# Patient Record
Sex: Female | Born: 1965 | Race: White | Hispanic: No | Marital: Married | Smoking: Never smoker
Health system: Southern US, Community
[De-identification: ages and names within clinical notes are randomized; demographics above are authoritative.]

## PROBLEM LIST (undated history)

## (undated) DIAGNOSIS — I729 Aneurysm of unspecified site: Secondary | ICD-10-CM

## (undated) DIAGNOSIS — I2699 Other pulmonary embolism without acute cor pulmonale: Secondary | ICD-10-CM

## (undated) HISTORY — DX: Aneurysm of unspecified site: I72.9

## (undated) HISTORY — DX: Other pulmonary embolism without acute cor pulmonale: I26.99

## (undated) HISTORY — PX: INGUINAL HERNIA REPAIR: SUR1180

## (undated) HISTORY — PX: WRIST SURGERY: SHX841

---

## 2001-04-16 ENCOUNTER — Encounter: Payer: Self-pay | Admitting: Obstetrics & Gynecology

## 2001-04-16 ENCOUNTER — Encounter: Admission: RE | Admit: 2001-04-16 | Discharge: 2001-04-16 | Payer: Self-pay | Admitting: Obstetrics & Gynecology

## 2002-04-26 ENCOUNTER — Other Ambulatory Visit: Admission: RE | Admit: 2002-04-26 | Discharge: 2002-04-26 | Payer: Self-pay | Admitting: Obstetrics & Gynecology

## 2003-05-05 ENCOUNTER — Other Ambulatory Visit: Admission: RE | Admit: 2003-05-05 | Discharge: 2003-05-05 | Payer: Self-pay | Admitting: Obstetrics & Gynecology

## 2005-06-13 ENCOUNTER — Encounter: Admission: RE | Admit: 2005-06-13 | Discharge: 2005-06-13 | Payer: Self-pay | Admitting: Obstetrics & Gynecology

## 2005-06-29 ENCOUNTER — Encounter (INDEPENDENT_AMBULATORY_CARE_PROVIDER_SITE_OTHER): Payer: Self-pay | Admitting: *Deleted

## 2005-06-29 ENCOUNTER — Ambulatory Visit (HOSPITAL_BASED_OUTPATIENT_CLINIC_OR_DEPARTMENT_OTHER): Admission: RE | Admit: 2005-06-29 | Discharge: 2005-06-29 | Payer: Self-pay | Admitting: *Deleted

## 2006-07-28 ENCOUNTER — Encounter: Admission: RE | Admit: 2006-07-28 | Discharge: 2006-07-28 | Payer: Self-pay | Admitting: Obstetrics & Gynecology

## 2007-07-30 ENCOUNTER — Encounter: Admission: RE | Admit: 2007-07-30 | Discharge: 2007-07-30 | Payer: Self-pay | Admitting: Obstetrics & Gynecology

## 2008-08-13 ENCOUNTER — Encounter: Admission: RE | Admit: 2008-08-13 | Discharge: 2008-08-13 | Payer: Self-pay | Admitting: Obstetrics & Gynecology

## 2009-08-17 ENCOUNTER — Encounter: Admission: RE | Admit: 2009-08-17 | Discharge: 2009-08-17 | Payer: Self-pay | Admitting: Obstetrics & Gynecology

## 2010-07-29 ENCOUNTER — Other Ambulatory Visit: Payer: Self-pay | Admitting: Obstetrics & Gynecology

## 2010-07-29 DIAGNOSIS — Z1231 Encounter for screening mammogram for malignant neoplasm of breast: Secondary | ICD-10-CM

## 2010-08-20 ENCOUNTER — Ambulatory Visit
Admission: RE | Admit: 2010-08-20 | Discharge: 2010-08-20 | Disposition: A | Discharge status: Home / Self Care | Payer: BC Managed Care – PPO | Source: Ambulatory Visit | Attending: Obstetrics & Gynecology | Admitting: Obstetrics & Gynecology

## 2010-08-20 DIAGNOSIS — Z1231 Encounter for screening mammogram for malignant neoplasm of breast: Secondary | ICD-10-CM

## 2010-08-20 NOTE — Op Note (Signed)
Tara, Stafford NO.:  [id number]   MEDICAL RECORD NO.:  [id number]          PATIENT TYPE:  AMB   LOCATION:  DSC                          FACILITY:  [facility]   PHYSICIAN:  Alfonse Ras, MD   DATE OF BIRTH:  1965-08-06   DATE OF PROCEDURE:  06/29/2005  DATE OF DISCHARGE:  06/29/2005                                 OPERATIVE REPORT   PREOPERATIVE DIAGNOSIS:  Left inguinal hernia.   POSTOPERATIVE DIAGNOSIS:  Left inguinal hernia, endometrioma of the indirect  sac.   OPERATION PERFORMED:  Left inguinal hernia repair with mesh.   SURGEON:  Alfonse Ras, MD   ANESTHESIA:  General.   DESCRIPTION OF PROCEDURE:  The patient was taken to the operating room and  placed in supine position.  After adequate  anesthesia was induced, the left  groin was prepped and draped in the normal sterile fashion.  Using an  oblique incision over the inguinal canal, I dissected down to the external  oblique fascia.  This was opened along its fibers.  Round ligament was  identified and an indirect hernia sac which was adjacent to it was dissected  off the round ligament.  Round ligament was suture ligated and divided.  The  hernia sac appeared to contain a significant amount of liquid.  It was  opened and blood tinged serous fluid was removed.  The sac was then ligated  at the internal ring and sent for pathologic evaluation which on final  pathology shows evidence of endometriosis.  The floor of Hesselbach's  triangle was reinforced with a piece of polypropylene mesh, tacking it to  the pubic tubercle, the transversalis fascia, the inguinal ligament and  brought out lateral to the internal ring.  Adequate hemostasis was ensured.  The external oblique fascia was closed with a running 3-0 Vicryl suture.  The skin was closed with staples.  The patient tolerated the procedure well  and went to PACU in good condition.      Alfonse Ras, MD  Electronically Signed     KRE/MEDQ  D:  07/28/2005  T:  07/29/2005  Job:  [id number]

## 2010-08-20 NOTE — Op Note (Signed)
NAMEJOYOUS, GLEGHORN NO.:  [id number]   MEDICAL RECORD NO.:  [id number]          PATIENT TYPE:  AMB   LOCATION:  DSC                          FACILITY:  [facility]   PHYSICIAN:  Alfonse Ras, MD   DATE OF BIRTH:  1966-02-08   DATE OF PROCEDURE:  06/29/2005  DATE OF DISCHARGE:                                 OPERATIVE REPORT   PREOPERATIVE DIAGNOSIS:  Left inguinal hernia.   POSTOPERATIVE DIAGNOSIS:  Left inguinal hernia.   OPERATION PERFORMED:  Left inguinal hernia repair with mesh.   SURGEON:  Alfonse Ras, MD   ANESTHESIA:  General.   DESCRIPTION OF PROCEDURE:  The patient was taken to the operating room and  placed in supine position.  After adequate general anesthesia was induced  using laryngeal mask, the left groin was prepped and draped in the normal  sterile fashion.  An oblique incision was made over the inguinal canal and I  dissected down to the external oblique fascia.  On opening the external  oblique fascia the hernia sac was entered and a large amount of blood tinged  serosanguineous fluid was encountered.  The sac was then completely  mobilized and was an indirect hernia sac.  Contents were inspected.  There  was no evidence of bowel, omentum or colon.  It was twisted and truncated at  its base and excised.  It was ligated with a 0 Surgilon.  The hernia defect  was closed by using interrupted 0 Surgilon sutures to approximately  transversalis fascia to the edge of the inguinal ligament in an interrupted  fashion.  A piece of Prolene mesh was then placed over this to reinforce it  and tacked using  1 and 2-0 Prolene suture from the pubic tubercle along the  transversalis fascia brought out far lateral to the internal ring and  attached it to the inguinal ligament.  Adequate hemostasis was ensured.  The  fascia was closed with a running 3-0 Vicryl.  Skin was closed with staples.  The patient tolerated the procedure well and was taken to PACU  in good  condition.      Alfonse Ras, MD  Electronically Signed     KRE/MEDQ  D:  06/29/2005  T:  07/01/2005  Job:  [id number]

## 2010-10-05 ENCOUNTER — Other Ambulatory Visit: Payer: Self-pay | Admitting: Obstetrics & Gynecology

## 2011-08-14 ENCOUNTER — Encounter (HOSPITAL_COMMUNITY): Payer: Self-pay | Admitting: Emergency Medicine

## 2011-08-14 ENCOUNTER — Emergency Department (HOSPITAL_COMMUNITY)
Admission: EM | Admit: 2011-08-14 | Discharge: 2011-08-15 | Disposition: A | Discharge status: Home / Self Care | Payer: 59 | Attending: Emergency Medicine | Admitting: Emergency Medicine

## 2011-08-14 ENCOUNTER — Emergency Department (HOSPITAL_COMMUNITY): Payer: 59

## 2011-08-14 DIAGNOSIS — K4090 Unilateral inguinal hernia, without obstruction or gangrene, not specified as recurrent: Secondary | ICD-10-CM | POA: Insufficient documentation

## 2011-08-14 DIAGNOSIS — R110 Nausea: Secondary | ICD-10-CM | POA: Insufficient documentation

## 2011-08-14 DIAGNOSIS — N83209 Unspecified ovarian cyst, unspecified side: Secondary | ICD-10-CM | POA: Insufficient documentation

## 2011-08-14 DIAGNOSIS — R109 Unspecified abdominal pain: Secondary | ICD-10-CM | POA: Insufficient documentation

## 2011-08-14 LAB — DIFFERENTIAL
Eosinophils Absolute: 0.1 10*3/uL (ref 0.0–0.7)
Lymphocytes Relative: 20 % (ref 12–46)
Lymphs Abs: 2 10*3/uL (ref 0.7–4.0)
Monocytes Relative: 6 % (ref 3–12)
Neutrophils Relative %: 73 % (ref 43–77)

## 2011-08-14 LAB — BASIC METABOLIC PANEL
BUN: 10 mg/dL (ref 6–23)
CO2: 27 mEq/L (ref 19–32)
Calcium: 8.9 mg/dL (ref 8.4–10.5)
Chloride: 104 mEq/L (ref 96–112)
GFR calc Af Amer: >90 mL/min (ref >90)
Glucose, Bld: 99 mg/dL (ref 70–99)
Potassium: 4.1 mEq/L (ref 3.5–5.1)

## 2011-08-14 LAB — URINALYSIS, ROUTINE W REFLEX MICROSCOPIC
Bilirubin Urine: NEGATIVE
Hgb urine dipstick: NEGATIVE
Nitrite: NEGATIVE
Specific Gravity, Urine: 1.006 (ref 1.005–1.030)
pH: 6.5 (ref 5.0–8.0)

## 2011-08-14 LAB — CBC
Hemoglobin: 13.2 g/dL (ref 12.0–15.0)
MCH: 31 pg (ref 26.0–34.0)
MCV: 92.5 fL (ref 78.0–100.0)
RBC: 4.26 MIL/uL (ref 3.87–5.11)

## 2011-08-14 LAB — PREGNANCY, URINE: Preg Test, Ur: NEGATIVE

## 2011-08-14 MED ORDER — MORPHINE SULFATE 4 MG/ML IJ SOLN
4.0000 mg | Freq: Once | INTRAMUSCULAR | Status: AC
  Administered 2011-08-14: 4 mg via INTRAVENOUS
  Filled 2011-08-14: qty 1

## 2011-08-14 MED ORDER — IOHEXOL 300 MG/ML  SOLN
20.0000 mL | INTRAMUSCULAR | Status: AC
  Administered 2011-08-14: 20 mL via ORAL

## 2011-08-14 MED ORDER — ONDANSETRON HCL 4 MG/2ML IJ SOLN
4.0000 mg | Freq: Once | INTRAMUSCULAR | Status: AC
  Administered 2011-08-14: 4 mg via INTRAVENOUS
  Filled 2011-08-14: qty 2

## 2011-08-14 MED ORDER — SODIUM CHLORIDE 0.9 % IV SOLN
INTRAVENOUS | Status: DC
  Administered 2011-08-14 (×2): via INTRAVENOUS

## 2011-08-14 MED ORDER — IOHEXOL 300 MG/ML  SOLN
100.0000 mL | Freq: Once | INTRAMUSCULAR | Status: AC | PRN
  Administered 2011-08-14: 100 mL via INTRAVENOUS

## 2011-08-14 NOTE — ED Notes (Signed)
C/o L sided abd pain that radiates to L flank x 1 week.  Reports nausea.  Denies urinary complaints.

## 2011-08-14 NOTE — ED Provider Notes (Signed)
History   This chart was scribed for Cheri Guppy, MD by Melba Coon. The patient was seen in room STRE6/STRE6 and the patient's care was started at 5:05PM.    CSN: [id number]  Arrival date & time 08/14/11  1543   None     Chief Complaint  Patient presents with  . Abdominal Pain    (Consider location/radiation/quality/duration/timing/severity/associated sxs/prior treatment) HPI Tara Stafford is a 46 y.o. female who presents to the Emergency Department complaining of constant, moderate to severe, left-sided, radiating, sharp abdominal pain with an onset one week ago. Pt is a nurse; last Sunday, pain started but she just thought she pulled a muscle and that it would go away; however, pain has gotten progressively worse. Physical exertion aggravates the pain. Pt has an inguinal hernia. Pt also states that she has a high tolerance for pain, but this is the worse pain she has felt. LNMP: 2 weeks ago. Nml urination. Nausea present. No HA, fever, neck pain, sore throat, rash, back pain, CP, SOB, emesis, diarrhea, dysuria, or extremity pain, edema, weakness, numbness, or tingling. Pt has Hx of C-sections and inguinal hernia repair. No known allergies. No other pertinent medical symptoms. Pt is a social drinker.  Past Medical History  Diagnosis Date  . Endometriosis     Past Surgical History  Procedure Date  . Cesarean section   . Inguinal hernia repair     No family history on file.  History  Substance Use Topics  . Smoking status: Never Smoker   . Smokeless tobacco: Not on file  . Alcohol Use: Yes    OB History    Grav Para Term Preterm Abortions TAB SAB Ect Mult Living                  Review of Systems 10 Systems reviewed and all are negative for acute change except as noted in the HPI.   Allergies  Review of patient's allergies indicates no known allergies.  Home Medications   Current Outpatient Rx  Name Route Sig Dispense Refill  . CALCIUM + D PO Oral  Take 1 tablet by mouth daily.    . OMEGA-3 FATTY ACIDS 1000 MG PO CAPS Oral Take 1 g by mouth daily.    Marland Kitchen NORGESTIM-ETH ESTRAD TRIPHASIC 0.18/0.215/0.25 MG-35 MCG PO TABS Oral Take 1 tablet by mouth daily.      BP 122/69  Pulse 64  Temp(Src) 98.1 F (36.7 C) (Oral)  Resp 16  SpO2 100%  LMP 07/31/2011  Physical Exam  Nursing note and vitals reviewed. Constitutional: She is oriented to person, place, and time. She appears well-developed and well-nourished. No distress.  HENT:  Head: Normocephalic and atraumatic.  Eyes: EOM are normal.  Neck: Normal range of motion. Neck supple. No tracheal deviation present.  Cardiovascular: Normal rate, regular rhythm and normal heart sounds.   No murmur heard. Pulmonary/Chest: Effort normal and breath sounds normal. No respiratory distress.       Lung sounds clear bilaterally  Abdominal: Soft. There is tenderness (moderate LLQ abd tenderness).  Musculoskeletal: Normal range of motion.  Neurological: She is alert and oriented to person, place, and time.  Skin: Skin is warm and dry.  Psychiatric: She has a normal mood and affect. Her behavior is normal.    ED Course  Procedures (including critical care time) 20, old female, with progressive left lower daughter, and abdominal pain, and tenderness.  For the past week.  Nausea, but no other symptoms.  Moderate tenderness  to palpation in the left lower quadrant, but no peritoneal signs.  No masses palpated.  Differential includes gynecological versus GI etiology for her symptoms.  We'll give IV analgesics, and antiemetics, and perform a CAT scan, and laboratory testing.  If the CT is negative.  a pelvic ultrasound is probably warranted.  DIAGNOSTIC STUDIES: Oxygen Saturation is 100% on room air, normal by my interpretation.    COORDINATION OF CARE:  5:12PM - EDMD will order IV fluids, UA, abd CT, and pain meds for the pt.  Labs Reviewed  BASIC METABOLIC PANEL - Abnormal; Notable for the  following:    GFR calc non Af Amer 86 (*)    All other components within normal limits  URINALYSIS, ROUTINE W REFLEX MICROSCOPIC  CBC  DIFFERENTIAL  PREGNANCY, URINE   No results found.   No diagnosis found.    MDM  Abdominal pain  I personally performed the services described in this documentation, which was scribed in my presence. The recorded information has been reviewed and considered.        Cheri Guppy, MD 08/14/11 [phone]

## 2011-08-14 NOTE — ED Notes (Signed)
Per [place] tech, advised pt that 2 others are in front of her.

## 2011-08-15 ENCOUNTER — Emergency Department (HOSPITAL_COMMUNITY): Payer: 59

## 2011-08-15 MED ORDER — HYDROCODONE-ACETAMINOPHEN 5-325 MG PO TABS: 1.0000 | ORAL_TABLET | ORAL | Status: AC | PRN

## 2011-08-15 MED ORDER — MORPHINE SULFATE 4 MG/ML IJ SOLN
4.0000 mg | Freq: Once | INTRAMUSCULAR | Status: AC
  Administered 2011-08-15: 4 mg via INTRAVENOUS
  Filled 2011-08-15: qty 1

## 2011-08-15 NOTE — ED Notes (Signed)
Patient transported to Ultrasound 

## 2011-08-15 NOTE — ED Notes (Signed)
Patient returned from Ultrasound. 

## 2011-08-15 NOTE — ED Provider Notes (Signed)
Tara Stafford S 8:00 PM patient discussed in sign out with Dr. Weldon Inches. Patient with left lower abdominal pains. Lab work has been unremarkable. CT was unremarkable aside from right-sided ovarian cyst. Ultrasound of pelvis pending. We'll continue to monitor patient and dispose according to ultrasound results.   Ultrasound demonstrates right and left ovarian cyst. Right cyst is 3.5 cm. There does not appear to be any other significant findings to explain emergent condition for patient's pain symptoms. At this time we'll discharge home with PCP an OB/GYN followup.     Angus Seller, PA 08/15/11 0201

## 2011-08-15 NOTE — Discharge Instructions (Signed)
You were seen and evaluated today for your complaints of left abdominal pains. Your lab tests today did not show any concerning signs or findings to explain your symptoms. Your CAT scan also did not show any signs for an emergent process in your abdomen. There was no signs today for appendicitis or inflammation of your bowels. You also had ultrasound studies performed. These did demonstrate a right and left ovarian cysts. The cyst on your right ovary was the largest at 3.5 cm. It is recommended that you continue to followup with your primary care provider or OB/GYN specialist regarding these findings. Return to the emergency room for any worsening symptoms, fever, chills, persistent nausea vomiting.  Abdominal Pain Abdominal pain can be caused by many things. Your caregiver decides the seriousness of your pain by an examination and possibly blood tests and X-rays. Many cases can be observed and treated at home. Most abdominal pain is not caused by a disease and will probably improve without treatment. However, in many cases, more time must pass before a clear cause of the pain can be found. Before that point, it may not be known if you need more testing, or if hospitalization or surgery is needed. HOME CARE INSTRUCTIONS   Do not take laxatives unless directed by your caregiver.   Take pain medicine only as directed by your caregiver.   Only take over-the-counter or prescription medicines for pain, discomfort, or fever as directed by your caregiver.   Try a clear liquid diet (broth, tea, or water) for as long as directed by your caregiver. Slowly move to a bland diet as tolerated.  SEEK IMMEDIATE MEDICAL CARE IF:   The pain does not go away.   You have a fever.   You keep throwing up (vomiting).   The pain is felt only in portions of the abdomen. Pain in the right side could possibly be appendicitis. In an adult, pain in the left lower portion of the abdomen could be colitis or diverticulitis.     You pass bloody or black tarry stools.  MAKE SURE YOU:   Understand these instructions.   Will watch your condition.   Will get help right away if you are not doing well or get worse.  Document Released: 12/29/2004 Document Revised: 03/10/2011 Document Reviewed: 11/07/2007 [facility] Patient Information 2012 [place], [place].    Ovarian Cyst The ovaries are small organs that are on each side of the uterus. The ovaries are the organs that produce the female hormones, estrogen and progesterone. An ovarian cyst is a sac filled with fluid that can vary in its size. It is normal for a small cyst to form in women who are in the childbearing age and who have menstrual periods. This type of cyst is called a follicle cyst that becomes an ovulation cyst (corpus luteum cyst) after it produces the women's egg. It later goes away on its own if the woman does not become pregnant. There are other kinds of ovarian cysts that may cause problems and may need to be treated. The most serious problem is a cyst with cancer. It should be noted that menopausal women who have an ovarian cyst are at a higher risk of it being a cancer cyst. They should be evaluated very quickly, thoroughly and followed closely. This is especially true in menopausal women because of the high rate of ovarian cancer in women in menopause. CAUSES AND TYPES OF OVARIAN CYSTS:  FUNCTIONAL CYST: The follicle/corpus luteum cyst is a functional cyst  that occurs every month during ovulation with the menstrual cycle. They go away with the next menstrual cycle if the woman does not get pregnant. Usually, there are no symptoms with a functional cyst.   ENDOMETRIOMA CYST: This cyst develops from the lining of the uterus tissue. This cyst gets in or on the ovary. It grows every month from the bleeding during the menstrual period. It is also called a "chocolate cyst" because it becomes filled with blood that turns brown. This cyst can cause pain in the  lower abdomen during intercourse and with your menstrual period.   CYSTADENOMA CYST: This cyst develops from the cells on the outside of the ovary. They usually are not cancerous. They can get very big and cause lower abdomen pain and pain with intercourse. This type of cyst can twist on itself, cut off its blood supply and cause severe pain. It also can easily rupture and cause a lot of pain.   DERMOID CYST: This type of cyst is sometimes found in both ovaries. They are found to have different kinds of body tissue in the cyst. The tissue includes skin, teeth, hair, and/or cartilage. They usually do not have symptoms unless they get very big. Dermoid cysts are rarely cancerous.   POLYCYSTIC OVARY: This is a rare condition with hormone problems that produces many small cysts on both ovaries. The cysts are follicle-like cysts that never produce an egg and become a corpus luteum. It can cause an increase in body weight, infertility, acne, increase in body and facial hair and lack of menstrual periods or rare menstrual periods. Many women with this problem develop type 2 diabetes. The exact cause of this problem is unknown. A polycystic ovary is rarely cancerous.   THECA LUTEIN CYST: Occurs when too much hormone (human chorionic gonadotropin) is produced and over-stimulates the ovaries to produce an egg. They are frequently seen when doctors stimulate the ovaries for invitro-fertilization (test tube babies).   LUTEOMA CYST: This cyst is seen during pregnancy. Rarely it can cause an obstruction to the birth canal during labor and delivery. They usually go away after delivery.  SYMPTOMS   Pelvic pain or pressure.   Pain during sexual intercourse.   Increasing girth (swelling) of the abdomen.   Abnormal menstrual periods.   Increasing pain with menstrual periods.   You stop having menstrual periods and you are not pregnant.  DIAGNOSIS  The diagnosis can be made during:  Routine or annual pelvic  examination (common).   Ultrasound.   X-ray of the pelvis.   CT Scan.   MRI.   Blood tests.  TREATMENT   Treatment may only be to follow the cyst monthly for 2 to 3 months with your caregiver. Many go away on their own, especially functional cysts.   May be aspirated (drained) with a long needle with ultrasound, or by laparoscopy (inserting a tube into the pelvis through a small incision).   The whole cyst can be removed by laparoscopy.   Sometimes the cyst may need to be removed through an incision in the lower abdomen.   Hormone treatment is sometimes used to help dissolve certain cysts.   Birth control pills are sometimes used to help dissolve certain cysts.  HOME CARE INSTRUCTIONS  Follow your caregiver's advice regarding:  Medicine.   Follow up visits to evaluate and treat the cyst.   You may need to come back or make an appointment with another caregiver, to find the exact cause of your cyst,  if your caregiver is not a gynecologist.   Get your yearly and recommended pelvic examinations and Pap tests.   Let your caregiver know if you have had an ovarian cyst in the past.  SEEK MEDICAL CARE IF:   Your periods are late, irregular, they stop, or are painful.   Your stomach (abdomen) or pelvic pain does not go away.   Your stomach becomes larger or swollen.   You have pressure on your bladder or trouble emptying your bladder completely.   You have painful sexual intercourse.   You have feelings of fullness, pressure, or discomfort in your stomach.   You lose weight for no apparent reason.   You feel generally ill.   You become constipated.   You lose your appetite.   You develop acne.   You have an increase in body and facial hair.   You are gaining weight, without changing your exercise and eating habits.   You think you are pregnant.  SEEK IMMEDIATE MEDICAL CARE IF:   You have increasing abdominal pain.   You feel sick to your stomach (nausea)  and/or vomit.   You develop a fever that comes on suddenly.   You develop abdominal pain during a bowel movement.   Your menstrual periods become heavier than usual.  Document Released: 03/21/2005 Document Revised: 03/10/2011 Document Reviewed: 01/22/2009 [facility] Patient Information 2012 [place], [place].    RESOURCE GUIDE  Dental Problems  Patients with Medicaid: [facility] [phone]                                           [id number] Phone:  [phone]                                                  Phone:  [phone]  If unable to pay or uninsured, contact:  [facility] or [facility]. to become qualified for the adult dental clinic.  Chronic Pain Problems Contact [facility]  [phone] Patients need to be referred by their primary care doctor.  Insufficient Money for Medicine Contact [employer]:  call "211" or [facility] [phone].  No Primary Care Doctor Call [facility]  434-424-5323 Other agencies that provide inexpensive medical care    [facility]  [phone]    [facility] Internal Medicine  [phone]    [facility]  [phone]    [facility]  [phone]    [facility]anned Parenthood  [phone]    [facility]  [phone]  Psychological Services [facility] Behavioral Health  [phone] [facility]  [phone] [facility] Mental Health   [phone] (emergency services [phone])  Substance Abuse Resources [facility]  617-529-5456 [facility] (413)312-9318 [facility] (762)478-5766 Floydene Flock 312-336-0282 Residential & Outpatient Substance Abuse Program  (534) 188-3707  Abuse/Neglect [facility] Child Abuse Hotline (317) 038-9708 [facility] Child Abuse Hotline 479-303-5223 (After Hours)  Emergency Shelter [facility] (272)438-2891  Maternity Homes Room at  the [place] [facility] [place] 747-391-2165 [facility] Services (  [phone]  MRSA Hotline #:   440-101-8614    [facility] Resources  [facility]     [employer]                          [facility] Dept. [address] [place]                       [address]      [address]  Blondell Reveal Phone:  [phone]                                   Phone:  [phone]                 Phone:  [phone]  [facility] Phone:  [phone]  [facility] Child Abuse Hotline 430-630-0569 775-281-0902 (After Hours)

## 2011-08-18 NOTE — ED Provider Notes (Signed)
Medical screening examination/treatment/procedure(s) were conducted as a shared visit with non-physician practitioner(s) and myself.  I personally evaluated the patient during the encounter.  No acute abdomen. CT scan of abdomen negative.  Ultrasound reveals right 3.5 cm ovarian cyst. Pain is on the left side.  Can be discharged  Donnetta Hutching, MD 08/18/11 [phone]

## 2011-08-22 ENCOUNTER — Other Ambulatory Visit: Payer: Self-pay | Admitting: Obstetrics & Gynecology

## 2011-08-22 DIAGNOSIS — Z1231 Encounter for screening mammogram for malignant neoplasm of breast: Secondary | ICD-10-CM

## 2011-08-30 ENCOUNTER — Ambulatory Visit
Admission: RE | Admit: 2011-08-30 | Discharge: 2011-08-30 | Disposition: A | Discharge status: Home / Self Care | Payer: 59 | Source: Ambulatory Visit | Attending: Obstetrics & Gynecology | Admitting: Obstetrics & Gynecology

## 2011-08-30 DIAGNOSIS — Z1231 Encounter for screening mammogram for malignant neoplasm of breast: Secondary | ICD-10-CM

## 2012-08-21 ENCOUNTER — Other Ambulatory Visit: Payer: Self-pay

## 2012-08-21 DIAGNOSIS — Z1231 Encounter for screening mammogram for malignant neoplasm of breast: Secondary | ICD-10-CM

## 2012-09-05 ENCOUNTER — Ambulatory Visit
Admission: RE | Admit: 2012-09-05 | Discharge: 2012-09-05 | Disposition: A | Discharge status: Home / Self Care | Payer: 59 | Source: Ambulatory Visit

## 2012-09-05 DIAGNOSIS — Z1231 Encounter for screening mammogram for malignant neoplasm of breast: Secondary | ICD-10-CM

## 2013-03-15 ENCOUNTER — Ambulatory Visit (INDEPENDENT_AMBULATORY_CARE_PROVIDER_SITE_OTHER): Payer: 59

## 2013-03-15 VITALS — BP 133/82 | HR 68 | Resp 18

## 2013-03-15 DIAGNOSIS — R52 Pain, unspecified: Secondary | ICD-10-CM

## 2013-03-15 DIAGNOSIS — M6740 Ganglion, unspecified site: Secondary | ICD-10-CM

## 2013-03-15 NOTE — Patient Instructions (Signed)
ICE INSTRUCTIONS  Apply ice or cold pack to the affected area at least 3 times a day for 10-15 minutes each time.  You should also use ice after prolonged activity or vigorous exercise.  Do not apply ice longer than 20 minutes at one time.  Always keep a cloth between your skin and the ice pack to prevent burns.  Being consistent and following these instructions will help control your symptoms.  We suggest you purchase a gel ice pack because they are reusable and do bit leak.  Some of them are designed to wrap around the area.  Use the method that works best for you.  Here are some other suggestions for icing.   Use a frozen bag of peas or corn-inexpensive and molds well to your body, usually stays frozen for 10 to 20 minutes.  Wet a towel with cold water and squeeze out the excess until it's damp.  Place in a bag in the freezer for 20 minutes. Then remove and use.  Alternate warm compress/ ice pack application to left foot over the cyst area. Apply daily hot or warm moist towel for 10 minutes the apply ice pack for 10 minutes repeat this process 2-3 times at least once or twice a day for the next 2-3 weeks.

## 2013-03-15 NOTE — Progress Notes (Signed)
   Subjective:    Patient ID: Tara Stafford, female    DOB: [birth year], 47 y.o.   MRN: [id number]  HPI My left foot and has a knot on top and it comes and goes and big toe is numb and has been going on for about 1 month     Review of Systems  Constitutional: Negative.   HENT: Negative.        Cold   Eyes: Negative.   Respiratory: Negative.   Cardiovascular: Negative.   Gastrointestinal: Negative.   Endocrine: Negative.   Genitourinary: Negative.   Musculoskeletal: Negative.   Skin: Negative.   Allergic/Immunologic: Negative.   Neurological: Positive for numbness.  Hematological: Negative.   Psychiatric/Behavioral: Negative.        Objective:   Physical Exam Neurovascular status is intact pedal pulses palpable bilateral Refill time 3 seconds all digits. Patient does have a numb spot over the first webspace and dorsum of the left hallux and present for last month actually happened since the appeared. There is a palpable nodular lesion over the second met cuneiform articulations site left foot. Slightly tender on direct palpation is freely movable from the underlying bone and overlying skin almost more like in consistency. Patient has a history of previous hammertoe surgery second toe left foot with well-healed scar from years ago. No history of recent trauma although surgeries may have aggravated the site over the dorsum of her left foot. X-rays reveal no exostoses no fractures no other osseous abnormalities currently soft tissue lesions noted likely ganglion cyst left foot       Assessment & Plan:   ganglion cyst left foot. Possibly some secondary neuritis or neuralgia neuropathy due to the compression patient also has a numb spot on the lateral left thigh area from a hernia repair that was done years ago and has a numb spot on the thigh that same leg. The numbness to the hallux may be coincidental however it may be from irritation or inflammation of the ganglion cyst. Patient is  currently on prednisone for sinus infection which may help reduce the inflammation of her foot suggested warm compress ice pack alternating and crossfire massage the area to manipulate the lesion if he continues to be painful symptomatic or enlarged they followup for excision or aspiration both options were discussed. Followup as needed with the next month if it persists or chews other more aggressive options  Alvan Dame DPM

## 2013-08-19 ENCOUNTER — Other Ambulatory Visit: Payer: Self-pay

## 2013-08-19 DIAGNOSIS — Z1231 Encounter for screening mammogram for malignant neoplasm of breast: Secondary | ICD-10-CM

## 2013-09-06 ENCOUNTER — Ambulatory Visit
Admission: RE | Admit: 2013-09-06 | Discharge: 2013-09-06 | Disposition: A | Discharge status: Home / Self Care | Payer: 59 | Source: Ambulatory Visit

## 2013-09-06 ENCOUNTER — Encounter (INDEPENDENT_AMBULATORY_CARE_PROVIDER_SITE_OTHER): Payer: Self-pay

## 2013-09-06 DIAGNOSIS — Z1231 Encounter for screening mammogram for malignant neoplasm of breast: Secondary | ICD-10-CM

## 2014-09-22 ENCOUNTER — Other Ambulatory Visit: Payer: Self-pay

## 2014-09-22 DIAGNOSIS — Z1231 Encounter for screening mammogram for malignant neoplasm of breast: Secondary | ICD-10-CM

## 2014-09-26 ENCOUNTER — Ambulatory Visit
Admission: RE | Admit: 2014-09-26 | Discharge: 2014-09-26 | Disposition: A | Discharge status: Home / Self Care | Payer: 59 | Source: Ambulatory Visit

## 2014-09-26 DIAGNOSIS — Z1231 Encounter for screening mammogram for malignant neoplasm of breast: Secondary | ICD-10-CM

## 2015-09-18 ENCOUNTER — Other Ambulatory Visit: Payer: Self-pay | Admitting: Obstetrics & Gynecology

## 2015-09-18 DIAGNOSIS — Z1231 Encounter for screening mammogram for malignant neoplasm of breast: Secondary | ICD-10-CM

## 2015-10-02 ENCOUNTER — Ambulatory Visit
Admission: RE | Admit: 2015-10-02 | Discharge: 2015-10-02 | Disposition: A | Discharge status: Home / Self Care | Payer: BLUE CROSS/BLUE SHIELD | Source: Ambulatory Visit | Attending: Obstetrics & Gynecology | Admitting: Obstetrics & Gynecology

## 2015-10-02 DIAGNOSIS — Z1231 Encounter for screening mammogram for malignant neoplasm of breast: Secondary | ICD-10-CM

## 2016-03-18 ENCOUNTER — Encounter: Payer: Self-pay | Admitting: Sports Medicine

## 2016-03-18 ENCOUNTER — Ambulatory Visit (INDEPENDENT_AMBULATORY_CARE_PROVIDER_SITE_OTHER): Payer: BLUE CROSS/BLUE SHIELD | Admitting: Sports Medicine

## 2016-03-18 DIAGNOSIS — M2042 Other hammer toe(s) (acquired), left foot: Secondary | ICD-10-CM | POA: Diagnosis not present

## 2016-03-18 DIAGNOSIS — Q742 Other congenital malformations of lower limb(s), including pelvic girdle: Secondary | ICD-10-CM | POA: Diagnosis not present

## 2016-03-18 DIAGNOSIS — M79675 Pain in left toe(s): Secondary | ICD-10-CM

## 2016-03-18 NOTE — Progress Notes (Signed)
Subjective: Tara Stafford is a 50 y.o. female patient who presents to office for evaluation of Left foot pain. Patient complains of progressive pain especially over several weeks at the left third toe. Reports that she had surgery back in 2011 to fix the left second hammertoe by Dr. Ralene CorkSikora and now the toe is significantly shorter making the left third toe the longest toe. She thinks that when she walks or stands excessive that her foot is sliding forward and that the toe is getting irritated states that over the last few weeks it has been so painful and very tender, especially with direct touch to the very and of the toe and to the very end of the nail. Patient also states that she has noticed callus forming to the tip of the toe as well as the toe starting to change in shape and a more hammered appearance. Patient reports that she has tried changing shoes and buying bigger size shoe, but she thinks this makes it worse. Because her feet slide around in shoe more which causes the long toe to be traumatized and painful. Patient denies any other pedal complaints.   There are no active problems to display for this patient.   Current Outpatient Prescriptions on File Prior to Visit  Medication Sig Dispense Refill  . azithromycin (ZITHROMAX) 250 MG tablet     . Calcium Carbonate-Vitamin D (CALCIUM + D PO) Take 1 tablet by mouth daily.    Marland Kitchen. CHERATUSSIN AC 100-10 MG/5ML syrup     . fish oil-omega-3 fatty acids 1000 MG capsule Take 1 g by mouth daily.    . Multiple Vitamin (MULTIVITAMIN) tablet Take 1 tablet by mouth daily.    . Norgestimate-Ethinyl Estradiol Triphasic (TRI-SPRINTEC) 0.18/0.215/0.25 MG-35 MCG tablet Take 1 tablet by mouth daily.    . predniSONE (DELTASONE) 20 MG tablet      No current facility-administered medications on file prior to visit.     No Known Allergies  Objective:  General: Alert and oriented x3 in no acute distress  Dermatology:Old surgical scar well healed. Small  hyperkeratotic lesion At the distal tuft of the left third toe and overlying fourth PIPJs dorsally bilateral. No open lesions bilateral lower extremities, no webspace macerations, no ecchymosis bilateral, all nails x 10 are well manicured.  Vascular: Dorsalis Pedis and Posterior Tibial pedal pulses 2/4, Capillary Fill Time 3 seconds,(+) pedal hair growth bilateral, no edema bilateral lower extremities, Temperature gradient within normal limits.  Neurology: Michaell CowingGross sensation intact via light touch bilateral.   Musculoskeletal: Semi-flexible hammertoes 3-5 on left and 2 through 5 on right with Mild tenderness with palpation at distal tufts of the longest toe, which is the left third toe. Ankle, Subtalar, Midtarsal, and MTPJ joint range of motion is within normal limits, there is no 1st ray hypermobility noted bilateral, No symptomatic bunion deformity noted bilateral. No pain with calf compression bilateral.  Strength within normal limits in all groups bilateral.        Assessment and Plan: Problem List Items Addressed This Visit    None    Visit Diagnoses    Hammertoe of left foot    -  Primary   Long toe       Toe pain, left          -Complete examination performed -Patient declined x-rays -Discussed treatement options for long, early left third hammertoe -Patient opt for conservative care at this time -Dispense left third toe silicone And metatarsal padding -Advised patient to choose  her shoes wisely that will give her more space in her toe box to prevent irritation to the problematic, left third toe -Encouraged patient to consider orthotics for long-term management -Patient to return to office as needed or sooner if condition worsens. Advised patient if this fails to offer any relief with conservative care to consider surgical correction of the elongated hammer third toe. Patient expressed understanding and will call for reappointment if necessary.  Asencion Islamitorya Zackery Brine, DPM

## 2016-11-15 ENCOUNTER — Other Ambulatory Visit: Payer: Self-pay | Admitting: Obstetrics & Gynecology

## 2016-11-15 DIAGNOSIS — Z1231 Encounter for screening mammogram for malignant neoplasm of breast: Secondary | ICD-10-CM

## 2016-11-28 ENCOUNTER — Ambulatory Visit
Admission: RE | Admit: 2016-11-28 | Discharge: 2016-11-28 | Disposition: A | Discharge status: Home / Self Care | Payer: 59 | Source: Ambulatory Visit | Attending: Obstetrics & Gynecology | Admitting: Obstetrics & Gynecology

## 2016-11-28 DIAGNOSIS — Z1231 Encounter for screening mammogram for malignant neoplasm of breast: Secondary | ICD-10-CM

## 2016-12-19 ENCOUNTER — Encounter: Payer: BLUE CROSS/BLUE SHIELD | Admitting: Obstetrics & Gynecology

## 2017-01-12 ENCOUNTER — Encounter: Payer: BLUE CROSS/BLUE SHIELD | Admitting: Obstetrics & Gynecology

## 2017-01-16 ENCOUNTER — Encounter: Payer: BLUE CROSS/BLUE SHIELD | Admitting: Obstetrics & Gynecology

## 2017-02-01 ENCOUNTER — Encounter: Payer: Self-pay | Admitting: Obstetrics & Gynecology

## 2017-02-01 ENCOUNTER — Ambulatory Visit (INDEPENDENT_AMBULATORY_CARE_PROVIDER_SITE_OTHER): Payer: 59 | Admitting: Obstetrics & Gynecology

## 2017-02-01 VITALS — BP 124/80 | Ht 69.75 in | Wt 213.0 lb

## 2017-02-01 DIAGNOSIS — Z780 Asymptomatic menopausal state: Secondary | ICD-10-CM | POA: Diagnosis not present

## 2017-02-01 DIAGNOSIS — N950 Postmenopausal bleeding: Secondary | ICD-10-CM | POA: Diagnosis not present

## 2017-02-01 DIAGNOSIS — Z01419 Encounter for gynecological examination (general) (routine) without abnormal findings: Secondary | ICD-10-CM | POA: Diagnosis not present

## 2017-02-01 DIAGNOSIS — Z86711 Personal history of pulmonary embolism: Secondary | ICD-10-CM | POA: Diagnosis not present

## 2017-02-01 NOTE — Patient Instructions (Signed)
1. Encounter for routine gynecological examination with Papanicolaou smear of cervix Normal gyn exam.  Pap reflex done.  Breasts wnl.  Mammo normal 2018. - CBC - Comp Met (CMET) - Lipid Profile - TSH - Vitamin D 1,25 dihydroxy  2. Menopause present Had recent vaginal bleeding.  Will recheck [facility].  No HRT.  Recent h/o bilateral PE.   - FSH  3. Postmenopausal bleeding R/O Endometrial pathology, polyp, fibroid, endometrial hyperplasia, endometrial cancer.  F/U pelvic US, possible EBx. - US Transvaginal Non-OB; Future  4. History of pulmonary embolism Work-up negative.  Tara Stafford, it was a pleasure seeing you today!  I will see you again soon for your Pelvic US.

## 2017-02-01 NOTE — Progress Notes (Signed)
Tara Stafford [birth year] [id number]   History:    51 y.o. G4P4  Boyfriend.  RP:  Established patient presenting for annual gyn exam  HPI:  Bilateral PE May 2018 on BCPs after traveling by plane.  D/Ced BCPs at that time.  Just finished Eliquis.  Had a menstrual period in 08/2016, then spotting x 2 weeks in Oct. 2018.  [place] in the spring per patient.  No pelvic pain.  Breasts wnl.  Mictions/BMs wnl.  Past medical history,surgical history, family history and social history were all reviewed and documented in the EPIC chart.  Gynecologic History Contraception: condoms Last Pap: 2012. Results were: normal Last mammogram: 2018. Results were: normal Colono to organize  Obstetric History OB History  Gravida Para Term Preterm AB Living  4 4       4   SAB TAB Ectopic Multiple Live Births               # Outcome Date GA Lbr Len/2nd Weight Sex Delivery Anes PTL Lv  4 Para           3 Para           2 Para           1 Para                ROS: A ROS was performed and pertinent positives and negatives are included in the history.  GENERAL: No fevers or chills. HEENT: No change in vision, no earache, sore throat or sinus congestion. NECK: No pain or stiffness. CARDIOVASCULAR: No chest pain or pressure. No palpitations. PULMONARY: No shortness of breath, cough or wheeze. GASTROINTESTINAL: No abdominal pain, nausea, vomiting or diarrhea, melena or bright red blood per rectum. GENITOURINARY: No urinary frequency, urgency, hesitancy or dysuria. MUSCULOSKELETAL: No joint or muscle pain, no back pain, no recent trauma. DERMATOLOGIC: No rash, no itching, no lesions. ENDOCRINE: No polyuria, polydipsia, no heat or cold intolerance. No recent change in weight. HEMATOLOGICAL: No anemia or easy bruising or bleeding. NEUROLOGIC: No headache, seizures, numbness, tingling or weakness. PSYCHIATRIC: No depression, no loss of interest in normal activity or change in sleep pattern.     Exam:   BP  124/80   Ht 5' 9.75" (1.772 m)   Wt 213 lb (96.6 kg)   BMI 30.78 kg/m   Body mass index is 30.78 kg/m.  General appearance : Well developed well nourished female. No acute distress HEENT: Eyes: no retinal hemorrhage or exudates,  Neck supple, trachea midline, no carotid bruits, no thyroidmegaly Lungs: Clear to auscultation, no rhonchi or wheezes, or rib retractions  Heart: Regular rate and rhythm, no murmurs or gallops Breast:Examined in sitting and supine position were symmetrical in appearance, no palpable masses or tenderness,  no skin retraction, no nipple inversion, no nipple discharge, no skin discoloration, no axillary or supraclavicular lymphadenopathy Abdomen: no palpable masses or tenderness, no rebound or guarding Extremities: no edema or skin discoloration or tenderness  Pelvic: Vulva normal  Bartholin, Urethra, Skene Glands: Within normal limits             Vagina: No gross lesions or discharge  Cervix: No gross lesions or discharge.  Pap reflex done.  Uterus  AV, normal size, shape and consistency, non-tender and mobile  Adnexa  Without masses or tenderness  Anus and perineum  normal   Assessment/Plan:  51 y.o. female for annual exam   1. Encounter for routine gynecological examination with Papanicolaou smear of cervix  Normal gyn exam.  Pap reflex done.  Breasts wnl.  Mammo normal 2018. - CBC - Comp Met (CMET) - Lipid Profile - TSH - Vitamin D 1,25 dihydroxy  2. Menopause present Had recent vaginal bleeding.  Will recheck [facility].  No HRT.  Recent h/o bilateral PE.   - FSH  3. Postmenopausal bleeding R/O Endometrial pathology, polyp, fibroid, endometrial hyperplasia, endometrial cancer.  F/U pelvic US, possible EBx. - US Transvaginal Non-OB; Future  4. History of pulmonary embolism Work-up negative.  Counseling >50% x 10 minutes  Princess Bruins MD, 8:23 AM 02/01/2017

## 2017-02-03 LAB — PAP IG W/ RFLX HPV ASCU

## 2017-02-06 LAB — COMPREHENSIVE METABOLIC PANEL
AG Ratio: 1.6 (calc) (ref 1.0–2.5)
ALT: 16 U/L (ref 6–29)
AST: 18 U/L (ref 10–35)
Albumin: 4.4 g/dL (ref 3.6–5.1)
Alkaline phosphatase (APISO): 67 U/L (ref 33–130)
BUN: 14 mg/dL (ref 7–25)
CHLORIDE: 103 mmol/L (ref 98–110)
CO2: 27 mmol/L (ref 20–32)
CREATININE: 0.86 mg/dL (ref 0.50–1.05)
Calcium: 9.2 mg/dL (ref 8.6–10.4)
GLOBULIN: 2.8 g/dL (ref 1.9–3.7)
GLUCOSE: 71 mg/dL (ref 65–99)
POTASSIUM: 4.1 mmol/L (ref 3.5–5.3)
SODIUM: 138 mmol/L (ref 135–146)
TOTAL PROTEIN: 7.2 g/dL (ref 6.1–8.1)
Total Bilirubin: 0.8 mg/dL (ref 0.2–1.2)

## 2017-02-06 LAB — CBC
HEMATOCRIT: 42.5 % (ref 35.0–45.0)
Hemoglobin: 14.7 g/dL (ref 11.7–15.5)
MCH: 30.8 pg (ref 27.0–33.0)
MCHC: 34.6 g/dL (ref 32.0–36.0)
MCV: 88.9 fL (ref 80.0–100.0)
MPV: 11.3 fL (ref 7.5–12.5)
Platelets: 204 10*3/uL (ref 140–400)
RBC: 4.78 10*6/uL (ref 3.80–5.10)
RDW: 13 % (ref 11.0–15.0)
WBC: 5.8 10*3/uL (ref 3.8–10.8)

## 2017-02-06 LAB — FOLLICLE STIMULATING HORMONE: FSH: 73.5 m[IU]/mL

## 2017-02-06 LAB — LIPID PANEL
CHOL/HDL RATIO: 2.3 (calc) (ref <5.0)
Cholesterol: 224 mg/dL — ABNORMAL HIGH (ref <200)
HDL: 99 mg/dL (ref >50)
LDL Cholesterol (Calc): 111 mg/dL (calc) — ABNORMAL HIGH
NON-HDL CHOLESTEROL (CALC): 125 mg/dL (ref <130)
Triglycerides: 62 mg/dL (ref <150)

## 2017-02-06 LAB — VITAMIN D 1,25 DIHYDROXY
VITAMIN D3 1, 25 (OH): 36 pg/mL
Vitamin D 1, 25 (OH)2 Total: 36 pg/mL (ref 18–72)

## 2017-02-06 LAB — TSH: TSH: 1.22 m[IU]/L

## 2017-02-22 ENCOUNTER — Other Ambulatory Visit: Payer: 59

## 2017-02-22 ENCOUNTER — Ambulatory Visit: Payer: 59 | Admitting: Obstetrics & Gynecology

## 2017-10-24 ENCOUNTER — Other Ambulatory Visit: Payer: Self-pay | Admitting: Obstetrics & Gynecology

## 2017-10-24 DIAGNOSIS — Z1231 Encounter for screening mammogram for malignant neoplasm of breast: Secondary | ICD-10-CM

## 2018-01-05 ENCOUNTER — Ambulatory Visit: Payer: 59

## 2018-01-12 ENCOUNTER — Ambulatory Visit
Admission: RE | Admit: 2018-01-12 | Discharge: 2018-01-12 | Disposition: A | Discharge status: Home / Self Care | Payer: 59 | Source: Ambulatory Visit | Attending: Obstetrics & Gynecology | Admitting: Obstetrics & Gynecology

## 2018-01-12 DIAGNOSIS — Z1231 Encounter for screening mammogram for malignant neoplasm of breast: Secondary | ICD-10-CM

## 2018-02-02 ENCOUNTER — Encounter: Payer: 59 | Admitting: Obstetrics & Gynecology

## 2018-02-08 ENCOUNTER — Encounter: Payer: 59 | Admitting: Obstetrics & Gynecology

## 2018-02-23 ENCOUNTER — Ambulatory Visit (INDEPENDENT_AMBULATORY_CARE_PROVIDER_SITE_OTHER): Payer: 59 | Admitting: Obstetrics & Gynecology

## 2018-02-23 ENCOUNTER — Encounter: Payer: Self-pay | Admitting: Obstetrics & Gynecology

## 2018-02-23 VITALS — BP 126/78 | Ht 69.0 in | Wt 221.0 lb

## 2018-02-23 DIAGNOSIS — Z01419 Encounter for gynecological examination (general) (routine) without abnormal findings: Secondary | ICD-10-CM

## 2018-02-23 DIAGNOSIS — Z780 Asymptomatic menopausal state: Secondary | ICD-10-CM | POA: Diagnosis not present

## 2018-02-23 DIAGNOSIS — E6609 Other obesity due to excess calories: Secondary | ICD-10-CM | POA: Diagnosis not present

## 2018-02-23 DIAGNOSIS — Z86711 Personal history of pulmonary embolism: Secondary | ICD-10-CM

## 2018-02-23 DIAGNOSIS — Z6832 Body mass index (BMI) 32.0-32.9, adult: Secondary | ICD-10-CM

## 2018-02-23 NOTE — Progress Notes (Signed)
Tara Stafford 1965/11/17 [id number]   History:    52 y.o. [place], getting married in 2020.  RP:  Established patient presenting for annual gyn exam   HPI: Menopause, well on no HRT.  No PMB.  High FSH and LMP 05/2017.  No pelvic pain.  No pain with IC.  Breasts normal.  BMI 32.64.  Plans to increase physical activity and improve nutrition.  Fasting Health Labs here today.  Past medical history,surgical history, family history and social history were all reviewed and documented in the EPIC chart.  Gynecologic History No LMP recorded. Patient is perimenopausal. Contraception: post menopausal status/Condoms Last Pap: 01/2017. Results were: Negative Last mammogram: 01/2018. Results were: Negative Bone Density: Never Colonoscopy: Will schedule 1st Screening Colono now  Obstetric History OB History  Gravida Para Term Preterm AB Living  4 4       4   SAB TAB Ectopic Multiple Live Births               # Outcome Date GA Lbr Len/2nd Weight Sex Delivery Anes PTL Lv  4 Para           3 Para           2 Para           1 Para              ROS: A ROS was performed and pertinent positives and negatives are included in the history.  GENERAL: No fevers or chills. HEENT: No change in vision, no earache, sore throat or sinus congestion. NECK: No pain or stiffness. CARDIOVASCULAR: No chest pain or pressure. No palpitations. PULMONARY: No shortness of breath, cough or wheeze. GASTROINTESTINAL: No abdominal pain, nausea, vomiting or diarrhea, melena or bright red blood per rectum. GENITOURINARY: No urinary frequency, urgency, hesitancy or dysuria. MUSCULOSKELETAL: No joint or muscle pain, no back pain, no recent trauma. DERMATOLOGIC: No rash, no itching, no lesions. ENDOCRINE: No polyuria, polydipsia, no heat or cold intolerance. No recent change in weight. HEMATOLOGICAL: No anemia or easy bruising or bleeding. NEUROLOGIC: No headache, seizures, numbness, tingling or weakness.  PSYCHIATRIC: No depression, no loss of interest in normal activity or change in sleep pattern.     Exam:   BP 126/78 (BP Location: Right Arm, Patient Position: Sitting, Cuff Size: Large)   Ht 5' 9"  (1.753 m)   Wt 221 lb (100.2 kg)   BMI 32.64 kg/m   Body mass index is 32.64 kg/m.  General appearance : Well developed well nourished female. No acute distress HEENT: Eyes: no retinal hemorrhage or exudates,  Neck supple, trachea midline, no carotid bruits, no thyroidmegaly Lungs: Clear to auscultation, no rhonchi or wheezes, or rib retractions  Heart: Regular rate and rhythm, no murmurs or gallops Breast:Examined in sitting and supine position were symmetrical in appearance, no palpable masses or tenderness,  no skin retraction, no nipple inversion, no nipple discharge, no skin discoloration, no axillary or supraclavicular lymphadenopathy Abdomen: no palpable masses or tenderness, no rebound or guarding Extremities: no edema or skin discoloration or tenderness  Pelvic: Vulva: Normal             Vagina: No gross lesions or discharge  Cervix: No gross lesions or discharge.  Pap/HPV HR done  Uterus  AV, normal size, shape and consistency, non-tender and mobile  Adnexa  Without masses or tenderness  Anus: Normal   Assessment/Plan:  52 y.o. female for annual exam   1. Encounter for routine gynecological  examination with Papanicolaou smear of cervix Normal gynecologic exam and menopause.  Pap with high-risk HPV done today.  Breast exam normal.  Screening mammogram October 2019 was negative.  Health labs here today.  Will schedule screening colonoscopy now. - CBC - Comp Met (CMET) - TSH - Lipid panel - VITAMIN D 25 Hydroxy (Vit-D Deficiency, Fractures) - PAP,TP IMGw/HPV RNA,rflx HPVTYPE16,18/45  2. Menopause present Well on no hormone replacement therapy.  No postmenopausal bleeding.  Recommend vitamin D supplements, calcium intake of 1.5 g/day and regular weightbearing physical  activities.  3. Hx pulmonary embolism Bilateral pulmonary embolism in May 2018 while patient was on birth control pills and traveling by plane.  Anticoagulation finished more than a year ago.  4. Class 1 obesity due to excess calories without serious comorbidity with body mass index (BMI) of 32.0 to 32.9 in adult Recommend lower calorie/carb diet such as [employer].  Aerobic physical activities 5 times a week with weightlifting every 2 days.  Other orders - Ascorbic Acid (VITAMIN C PO); Take 1 tablet by mouth daily.  Princess Bruins MD, 8:59 AM 02/23/2018

## 2018-02-24 LAB — CBC
HCT: 42.9 % (ref 35.0–45.0)
HEMOGLOBIN: 14.6 g/dL (ref 11.7–15.5)
MCH: 30.9 pg (ref 27.0–33.0)
MCHC: 34 g/dL (ref 32.0–36.0)
MCV: 90.9 fL (ref 80.0–100.0)
MPV: 11.9 fL (ref 7.5–12.5)
Platelets: 221 10*3/uL (ref 140–400)
RBC: 4.72 10*6/uL (ref 3.80–5.10)
RDW: 12.1 % (ref 11.0–15.0)
WBC: 6.9 10*3/uL (ref 3.8–10.8)

## 2018-02-24 LAB — COMPREHENSIVE METABOLIC PANEL
AG RATIO: 1.6 (calc) (ref 1.0–2.5)
ALBUMIN MSPROF: 4.4 g/dL (ref 3.6–5.1)
ALT: 15 U/L (ref 6–29)
AST: 19 U/L (ref 10–35)
Alkaline phosphatase (APISO): 63 U/L (ref 33–130)
BUN: 15 mg/dL (ref 7–25)
CHLORIDE: 103 mmol/L (ref 98–110)
CO2: 24 mmol/L (ref 20–32)
Calcium: 9.7 mg/dL (ref 8.6–10.4)
Creat: 0.99 mg/dL (ref 0.50–1.05)
GLOBULIN: 2.7 g/dL (ref 1.9–3.7)
GLUCOSE: 83 mg/dL (ref 65–99)
POTASSIUM: 4.3 mmol/L (ref 3.5–5.3)
SODIUM: 138 mmol/L (ref 135–146)
TOTAL PROTEIN: 7.1 g/dL (ref 6.1–8.1)
Total Bilirubin: 0.9 mg/dL (ref 0.2–1.2)

## 2018-02-24 LAB — LIPID PANEL
CHOL/HDL RATIO: 3.2 (calc) (ref <5.0)
Cholesterol: 235 mg/dL — ABNORMAL HIGH (ref <200)
HDL: 73 mg/dL (ref >50)
LDL CHOLESTEROL (CALC): 145 mg/dL — AB
NON-HDL CHOLESTEROL (CALC): 162 mg/dL — AB (ref <130)
Triglycerides: 70 mg/dL (ref <150)

## 2018-02-24 LAB — TSH: TSH: 1 mIU/L

## 2018-02-24 LAB — VITAMIN D 25 HYDROXY (VIT D DEFICIENCY, FRACTURES): VIT D 25 HYDROXY: 24 ng/mL — AB (ref 30–100)

## 2018-02-26 ENCOUNTER — Other Ambulatory Visit: Payer: Self-pay | Admitting: Obstetrics & Gynecology

## 2018-02-26 MED ORDER — VITAMIN D (ERGOCALCIFEROL) 1.25 MG (50000 UNIT) PO CAPS: 50000.0000 [IU] | ORAL_CAPSULE | ORAL | 0 refills | Status: DC

## 2018-02-27 LAB — PAP, TP IMAGING W/ HPV RNA, RFLX HPV TYPE 16,18/45: HPV DNA High Risk: NOT DETECTED

## 2018-03-02 ENCOUNTER — Encounter: Payer: Self-pay | Admitting: Obstetrics & Gynecology

## 2018-03-02 NOTE — Patient Instructions (Signed)
1. Encounter for routine gynecological examination with Papanicolaou smear of cervix Normal gynecologic exam and menopause.  Pap with high-risk HPV done today.  Breast exam normal.  Screening mammogram October 2019 was negative.  Health labs here today.  Will schedule screening colonoscopy now. - CBC - Comp Met (CMET) - TSH - Lipid panel - VITAMIN D 25 Hydroxy (Vit-D Deficiency, Fractures) - PAP,TP IMGw/HPV RNA,rflx HPVTYPE16,18/45  2. Menopause present Well on no hormone replacement therapy.  No postmenopausal bleeding.  Recommend vitamin D supplements, calcium intake of 1.5 g/day and regular weightbearing physical activities.  3. Hx pulmonary embolism Bilateral pulmonary embolism in May 2018 while patient was on birth control pills and traveling by plane.  Anticoagulation finished more than a year ago.  4. Class 1 obesity due to excess calories without serious comorbidity with body mass index (BMI) of 32.0 to 32.9 in adult Recommend lower calorie/carb diet such as [employer].  Aerobic physical activities 5 times a week with weightlifting every 2 days.  Other orders - Ascorbic Acid (VITAMIN C PO); Take 1 tablet by mouth daily.  Tara Stafford, it was a pleasure seeing you today!  I will inform you of your results as soon as they are available.

## 2018-11-22 ENCOUNTER — Other Ambulatory Visit: Payer: Self-pay | Admitting: Obstetrics & Gynecology

## 2018-11-22 DIAGNOSIS — Z1231 Encounter for screening mammogram for malignant neoplasm of breast: Secondary | ICD-10-CM

## 2019-01-22 ENCOUNTER — Ambulatory Visit: Payer: 59

## 2019-02-25 ENCOUNTER — Ambulatory Visit
Admission: RE | Admit: 2019-02-25 | Discharge: 2019-02-25 | Disposition: A | Discharge status: Home / Self Care | Payer: 59 | Source: Ambulatory Visit | Attending: Obstetrics & Gynecology | Admitting: Obstetrics & Gynecology

## 2019-02-25 ENCOUNTER — Other Ambulatory Visit: Payer: Self-pay

## 2019-02-25 DIAGNOSIS — Z1231 Encounter for screening mammogram for malignant neoplasm of breast: Secondary | ICD-10-CM

## 2019-02-26 ENCOUNTER — Other Ambulatory Visit: Payer: Self-pay

## 2019-02-27 ENCOUNTER — Encounter: Payer: Self-pay | Admitting: Obstetrics & Gynecology

## 2019-02-27 ENCOUNTER — Ambulatory Visit (INDEPENDENT_AMBULATORY_CARE_PROVIDER_SITE_OTHER): Payer: 59 | Admitting: Obstetrics & Gynecology

## 2019-02-27 VITALS — BP 118/74 | Ht 69.5 in | Wt 195.0 lb

## 2019-02-27 DIAGNOSIS — Z780 Asymptomatic menopausal state: Secondary | ICD-10-CM | POA: Diagnosis not present

## 2019-02-27 DIAGNOSIS — Z01419 Encounter for gynecological examination (general) (routine) without abnormal findings: Secondary | ICD-10-CM

## 2019-02-27 DIAGNOSIS — E663 Overweight: Secondary | ICD-10-CM

## 2019-02-27 NOTE — Addendum Note (Signed)
Addended by: Thurnell Garbe A on: 02/27/2019 09:53 AM   Modules accepted: Orders

## 2019-02-27 NOTE — Patient Instructions (Signed)
1. Encounter for routine gynecological examination with Papanicolaou smear of cervix Normal gynecologic exam.  Pap reflex done.  Breast exam normal.  Screening mammogram done 2 days ago, pending results.  Fasting health labs here today.  Screening colonoscopy not done yet, recommend scheduling this year. - CBC - Comp Met (CMET) - Lipid panel - TSH - VITAMIN D 25 Hydroxy (Vit-D Deficiency, Fractures)  2. Postmenopause Well on no hormone replacement therapy.  No postmenopausal bleeding.  May use coconut oil for intercourse as needed.  Recommend vitamin D supplements, calcium intake of 1200 mg daily and regular weightbearing physical activities.  3. Overweight (BMI 25.0-29.9) Doing very well with Weight [employer] for weight loss.  Body mass index went from 32.64 last year to 28.38 now.  Also doing aerobic and weightlifting activities on a regular basis.  Other orders - OVER THE COUNTER MEDICATION; Cure med- vitamin  [name], it was a pleasure seeing you today!  I will inform you of your results as soon as they are available.

## 2019-02-27 NOTE — Progress Notes (Signed)
Tara Stafford [facility] [birth year] [id number]   History:    53 y.o. G4P4L4 Got married in 2020.  Nurse in Dialysis.  RP:  Established patient presenting for annual gyn exam   HPI: Menopause, well on no HRT.  No PMB.  High FSH and LMP 05/2017.  No pelvic pain.  No pain with IC.  Breasts normal.  BMI improved to 28.38 (from 32.64 last year).  Physically active and healthy nutrition, joined [employer] with her husband.  Fasting Health Labs here today.  Past medical history,surgical history, family history and social history were all reviewed and documented in the EPIC chart.  Gynecologic History Patient's last menstrual period was 07/22/2013. Contraception: post menopausal status Last Pap: 02/2018. Results were: Negative/HPV HR neg Last mammogram: 02/2019. Results were: Pending Bone Density: Never Colonoscopy: Not yet  Obstetric History OB History  Gravida Para Term Preterm AB Living  4 4       4   SAB TAB Ectopic Multiple Live Births               # Outcome Date GA Lbr Len/2nd Weight Sex Delivery Anes PTL Lv  4 Para           3 Para           2 Para           1 Para              ROS: A ROS was performed and pertinent positives and negatives are included in the history.  GENERAL: No fevers or chills. HEENT: No change in vision, no earache, sore throat or sinus congestion. NECK: No pain or stiffness. CARDIOVASCULAR: No chest pain or pressure. No palpitations. PULMONARY: No shortness of breath, cough or wheeze. GASTROINTESTINAL: No abdominal pain, nausea, vomiting or diarrhea, melena or bright red blood per rectum. GENITOURINARY: No urinary frequency, urgency, hesitancy or dysuria. MUSCULOSKELETAL: No joint or muscle pain, no back pain, no recent trauma. DERMATOLOGIC: No rash, no itching, no lesions. ENDOCRINE: No polyuria, polydipsia, no heat or cold intolerance. No recent change in weight. HEMATOLOGICAL: No anemia or easy bruising or bleeding. NEUROLOGIC: No headache, seizures,  numbness, tingling or weakness. PSYCHIATRIC: No depression, no loss of interest in normal activity or change in sleep pattern.     Exam:   BP 118/74   Ht 5' 9.5" (1.765 m)   Wt 195 lb (88.5 kg)   LMP 07/22/2013   BMI 28.38 kg/m   Body mass index is 28.38 kg/m.  General appearance : Well developed well nourished female. No acute distress HEENT: Eyes: no retinal hemorrhage or exudates,  Neck supple, trachea midline, no carotid bruits, no thyroidmegaly Lungs: Clear to auscultation, no rhonchi or wheezes, or rib retractions  Heart: Regular rate and rhythm, no murmurs or gallops Breast:Examined in sitting and supine position were symmetrical in appearance, no palpable masses or tenderness,  no skin retraction, no nipple inversion, no nipple discharge, no skin discoloration, no axillary or supraclavicular lymphadenopathy Abdomen: no palpable masses or tenderness, no rebound or guarding Extremities: no edema or skin discoloration or tenderness  Pelvic: Vulva: Normal             Vagina: No gross lesions or discharge  Cervix: No gross lesions or discharge.  Pap reflex done.  Uterus  AV, normal size, shape and consistency, non-tender and mobile  Adnexa  Without masses or tenderness  Anus: Normal   Assessment/Plan:  54 y.o. female for annual exam   1.  Encounter for routine gynecological examination with Papanicolaou smear of cervix Normal gynecologic exam.  Pap reflex done.  Breast exam normal.  Screening mammogram done 2 days ago, pending results.  Fasting health labs here today.  Screening colonoscopy not done yet, recommend scheduling this year. - CBC - Comp Met (CMET) - Lipid panel - TSH - VITAMIN D 25 Hydroxy (Vit-D Deficiency, Fractures)  2. Postmenopause Well on no hormone replacement therapy.  No postmenopausal bleeding.  May use coconut oil for intercourse as needed.  Recommend vitamin D supplements, calcium intake of 1200 mg daily and regular weightbearing physical  activities.  3. Overweight (BMI 25.0-29.9) Doing very well with [employer] for weight loss.  Body mass index went from 32.64 last year to 28.38 now.  Also doing aerobic and weightlifting activities on a regular basis.  Other orders - OVER THE COUNTER MEDICATION; Cure med- vitamin  Princess Bruins MD, 8:53 AM 02/27/2019

## 2019-02-28 LAB — CBC
HCT: 41.7 % (ref 35.0–45.0)
Hemoglobin: 14 g/dL (ref 11.7–15.5)
MCH: 31 pg (ref 27.0–33.0)
MCHC: 33.6 g/dL (ref 32.0–36.0)
MCV: 92.5 fL (ref 80.0–100.0)
MPV: 12.1 fL (ref 7.5–12.5)
Platelets: 206 10*3/uL (ref 140–400)
RBC: 4.51 10*6/uL (ref 3.80–5.10)
RDW: 11.8 % (ref 11.0–15.0)
WBC: 6.6 10*3/uL (ref 3.8–10.8)

## 2019-02-28 LAB — COMPREHENSIVE METABOLIC PANEL
AG Ratio: 1.5 (calc) (ref 1.0–2.5)
ALT: 14 U/L (ref 6–29)
AST: 19 U/L (ref 10–35)
Albumin: 4.1 g/dL (ref 3.6–5.1)
Alkaline phosphatase (APISO): 59 U/L (ref 37–153)
BUN: 15 mg/dL (ref 7–25)
CO2: 26 mmol/L (ref 20–32)
Calcium: 9.4 mg/dL (ref 8.6–10.4)
Chloride: 105 mmol/L (ref 98–110)
Creat: 0.78 mg/dL (ref 0.50–1.05)
Globulin: 2.7 g/dL (calc) (ref 1.9–3.7)
Glucose, Bld: 73 mg/dL (ref 65–99)
Potassium: 4.2 mmol/L (ref 3.5–5.3)
Sodium: 141 mmol/L (ref 135–146)
Total Bilirubin: 0.7 mg/dL (ref 0.2–1.2)
Total Protein: 6.8 g/dL (ref 6.1–8.1)

## 2019-02-28 LAB — LIPID PANEL
Cholesterol: 200 mg/dL — ABNORMAL HIGH (ref <200)
HDL: 78 mg/dL (ref >=50)
LDL Cholesterol (Calc): 108 mg/dL (calc) — ABNORMAL HIGH
Non-HDL Cholesterol (Calc): 122 mg/dL (calc) (ref <130)
Total CHOL/HDL Ratio: 2.6 (calc) (ref <5.0)
Triglycerides: 57 mg/dL (ref <150)

## 2019-02-28 LAB — TSH: TSH: 1.23 mIU/L

## 2019-02-28 LAB — VITAMIN D 25 HYDROXY (VIT D DEFICIENCY, FRACTURES): Vit D, 25-Hydroxy: 20 ng/mL — ABNORMAL LOW (ref 30–100)

## 2019-03-01 LAB — PAP IG W/ RFLX HPV ASCU

## 2019-03-11 ENCOUNTER — Other Ambulatory Visit: Payer: Self-pay

## 2019-03-11 DIAGNOSIS — E559 Vitamin D deficiency, unspecified: Secondary | ICD-10-CM

## 2019-03-11 MED ORDER — VITAMIN D (ERGOCALCIFEROL) 1.25 MG (50000 UNIT) PO CAPS: 50000.0000 [IU] | ORAL_CAPSULE | ORAL | 0 refills | Status: DC

## 2019-03-11 NOTE — Telephone Encounter (Signed)
Spoke with patient and informed her. °

## 2020-01-24 ENCOUNTER — Other Ambulatory Visit: Payer: Self-pay | Admitting: Obstetrics & Gynecology

## 2020-01-24 DIAGNOSIS — Z1231 Encounter for screening mammogram for malignant neoplasm of breast: Secondary | ICD-10-CM

## 2020-02-26 ENCOUNTER — Ambulatory Visit
Admission: RE | Admit: 2020-02-26 | Discharge: 2020-02-26 | Disposition: A | Discharge status: Home / Self Care | Payer: 59 | Source: Ambulatory Visit | Attending: Obstetrics & Gynecology | Admitting: Obstetrics & Gynecology

## 2020-02-26 ENCOUNTER — Other Ambulatory Visit: Payer: Self-pay

## 2020-02-26 DIAGNOSIS — Z1231 Encounter for screening mammogram for malignant neoplasm of breast: Secondary | ICD-10-CM

## 2020-03-06 ENCOUNTER — Other Ambulatory Visit: Payer: Self-pay

## 2020-03-06 ENCOUNTER — Encounter: Payer: Self-pay | Admitting: Obstetrics & Gynecology

## 2020-03-06 ENCOUNTER — Ambulatory Visit (INDEPENDENT_AMBULATORY_CARE_PROVIDER_SITE_OTHER): Payer: 59 | Admitting: Obstetrics & Gynecology

## 2020-03-06 VITALS — BP 120/72 | Ht 69.5 in | Wt 209.0 lb

## 2020-03-06 DIAGNOSIS — Z01419 Encounter for gynecological examination (general) (routine) without abnormal findings: Secondary | ICD-10-CM

## 2020-03-06 DIAGNOSIS — E6609 Other obesity due to excess calories: Secondary | ICD-10-CM

## 2020-03-06 DIAGNOSIS — Z780 Asymptomatic menopausal state: Secondary | ICD-10-CM | POA: Diagnosis not present

## 2020-03-06 DIAGNOSIS — Z6830 Body mass index (BMI) 30.0-30.9, adult: Secondary | ICD-10-CM | POA: Diagnosis not present

## 2020-03-06 LAB — COMPREHENSIVE METABOLIC PANEL
AG Ratio: 1.6 (calc) (ref 1.0–2.5)
ALT: 15 U/L (ref 6–29)
AST: 20 U/L (ref 10–35)
Albumin: 4.4 g/dL (ref 3.6–5.1)
Alkaline phosphatase (APISO): 55 U/L (ref 37–153)
BUN: 17 mg/dL (ref 7–25)
CO2: 30 mmol/L (ref 20–32)
Calcium: 9.8 mg/dL (ref 8.6–10.4)
Chloride: 104 mmol/L (ref 98–110)
Creat: 0.88 mg/dL (ref 0.50–1.05)
Globulin: 2.7 g/dL (calc) (ref 1.9–3.7)
Glucose, Bld: 85 mg/dL (ref 65–99)
Potassium: 4.2 mmol/L (ref 3.5–5.3)
Sodium: 139 mmol/L (ref 135–146)
Total Bilirubin: 0.7 mg/dL (ref 0.2–1.2)
Total Protein: 7.1 g/dL (ref 6.1–8.1)

## 2020-03-06 LAB — TSH: TSH: 1.54 mIU/L

## 2020-03-06 LAB — LIPID PANEL
Cholesterol: 216 mg/dL — ABNORMAL HIGH
HDL: 87 mg/dL
LDL Cholesterol (Calc): 114 mg/dL — ABNORMAL HIGH
Non-HDL Cholesterol (Calc): 129 mg/dL
Total CHOL/HDL Ratio: 2.5 (calc)
Triglycerides: 68 mg/dL

## 2020-03-06 LAB — CBC
HCT: 42.1 % (ref 35.0–45.0)
Hemoglobin: 13.9 g/dL (ref 11.7–15.5)
MCH: 30.9 pg (ref 27.0–33.0)
MCHC: 33 g/dL (ref 32.0–36.0)
MCV: 93.6 fL (ref 80.0–100.0)
MPV: 12.1 fL (ref 7.5–12.5)
Platelets: 190 10*3/uL (ref 140–400)
RBC: 4.5 Million/uL (ref 3.80–5.10)
RDW: 11.7 % (ref 11.0–15.0)
WBC: 6.1 10*3/uL (ref 3.8–10.8)

## 2020-03-06 LAB — VITAMIN D 25 HYDROXY (VIT D DEFICIENCY, FRACTURES): Vit D, 25-Hydroxy: 32 ng/mL (ref 30–100)

## 2020-03-06 NOTE — Progress Notes (Signed)
Bryley Chrisman [facility] 04/24/1965 [id number]   History:    54 y.o. G4P4L4 Got married in 2020.  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  GQ:[id number] presenting for annual gyn exam   ZPH:[id number], well on no HRT. No PMB. No pelvic pain. No pain with IC. Breasts normal. BMI 30.42. Physically active and healthy nutrition, joined [employer] with her husband. Fasting Health Labs here today.   Past medical history,surgical history, family history and social history were all reviewed and documented in the EPIC chart.  Gynecologic History Patient's last menstrual period was 07/22/2013.  Obstetric History OB History  Gravida Para Term Preterm AB Living  4 4       4   SAB TAB Ectopic Multiple Live Births               # Outcome Date GA Lbr Len/2nd Weight Sex Delivery Anes PTL Lv  4 Para           3 Para           2 Para           1 Para              ROS: A ROS was performed and pertinent positives and negatives are included in the history.  GENERAL: No fevers or chills. HEENT: No change in vision, no earache, sore throat or sinus congestion. NECK: No pain or stiffness. CARDIOVASCULAR: No chest pain or pressure. No palpitations. PULMONARY: No shortness of breath, cough or wheeze. GASTROINTESTINAL: No abdominal pain, nausea, vomiting or diarrhea, melena or bright red blood per rectum. GENITOURINARY: No urinary frequency, urgency, hesitancy or dysuria. MUSCULOSKELETAL: No joint or muscle pain, no back pain, no recent trauma. DERMATOLOGIC: No rash, no itching, no lesions. ENDOCRINE: No polyuria, polydipsia, no heat or cold intolerance. No recent change in weight. HEMATOLOGICAL: No anemia or easy bruising or bleeding. NEUROLOGIC: No headache, seizures, numbness, tingling or weakness. PSYCHIATRIC: No depression, no loss of interest in normal activity or change in sleep pattern.     Exam:   BP 120/72   Ht 5' 9.5" (1.765 m)   Wt 209 lb (94.8 kg)   LMP 07/22/2013   BMI 30.42  kg/m   Body mass index is 30.42 kg/m.  General appearance : Well developed well nourished female. No acute distress HEENT: Eyes: no retinal hemorrhage or exudates,  Neck supple, trachea midline, no carotid bruits, no thyroidmegaly Lungs: Clear to auscultation, no rhonchi or wheezes, or rib retractions  Heart: Regular rate and rhythm, no murmurs or gallops Breast:Examined in sitting and supine position were symmetrical in appearance, no palpable masses or tenderness,  no skin retraction, no nipple inversion, no nipple discharge, no skin discoloration, no axillary or supraclavicular lymphadenopathy Abdomen: no palpable masses or tenderness, no rebound or guarding Extremities: no edema or skin discoloration or tenderness  Pelvic: Vulva: Normal             Vagina: No gross lesions or discharge  Cervix: No gross lesions or discharge.  Pap reflex done.  Uterus  AV, normal size, shape and consistency, non-tender and mobile  Adnexa  Without masses or tenderness  Anus: Normal   Assessment/Plan:  54 y.o. female for annual exam   1. Encounter for routine gynecological examination with Papanicolaou smear of cervix Normal gynecologic exam in menopause.  Pap reflex done.  Breast exam normal.  Screening mammogram November 2021 was negative.  Will organize screening colonoscopy through her family physician.  Fasting health labs here today. -  CBC - Comp Met (CMET) - TSH - Lipid panel - VITAMIN D 25 Hydroxy (Vit-D Deficiency, Fractures)  2. Postmenopause Well on no hormone replacement therapy.  No postmenopausal bleeding.  Recommend vitamin D supplements, calcium intake of 1500 mg daily and regular weightbearing physical activities.  3. Class 1 obesity due to excess calories without serious comorbidity with body mass index (BMI) of 30.0 to 30.9 in adult Continue weight watcher with a low calorie/carb diet.  Aerobic activities five times a week and light weightlifting every 2 days.  Other  orders - cholecalciferol (VITAMIN D3) 25 MCG (1000 UNIT) tablet; Take 2,000 Units by mouth daily. - zinc gluconate 50 MG tablet; Take 50 mg by mouth daily. - Fish Oil-Cholecalciferol (OMEGA-3 FISH OIL/VITAMIN D3) 1000-1000 MG-UNIT CAPS; Take 2,000 mg by mouth.  Princess Bruins MD, 8:48 AM 03/06/2020

## 2020-03-10 LAB — PAP IG W/ RFLX HPV ASCU

## 2021-01-12 ENCOUNTER — Other Ambulatory Visit: Payer: Self-pay | Admitting: Obstetrics & Gynecology

## 2021-01-12 DIAGNOSIS — Z1231 Encounter for screening mammogram for malignant neoplasm of breast: Secondary | ICD-10-CM

## 2021-03-01 ENCOUNTER — Other Ambulatory Visit: Payer: Self-pay

## 2021-03-01 ENCOUNTER — Ambulatory Visit
Admission: RE | Admit: 2021-03-01 | Discharge: 2021-03-01 | Disposition: A | Discharge status: Home / Self Care | Payer: 59 | Source: Ambulatory Visit | Attending: Obstetrics & Gynecology | Admitting: Obstetrics & Gynecology

## 2021-03-01 DIAGNOSIS — Z1231 Encounter for screening mammogram for malignant neoplasm of breast: Secondary | ICD-10-CM

## 2021-03-09 ENCOUNTER — Ambulatory Visit (INDEPENDENT_AMBULATORY_CARE_PROVIDER_SITE_OTHER): Payer: 59 | Admitting: Obstetrics & Gynecology

## 2021-03-09 ENCOUNTER — Other Ambulatory Visit: Payer: Self-pay

## 2021-03-09 ENCOUNTER — Encounter: Payer: Self-pay | Admitting: Obstetrics & Gynecology

## 2021-03-09 ENCOUNTER — Other Ambulatory Visit (HOSPITAL_COMMUNITY)
Admission: RE | Admit: 2021-03-09 | Discharge: 2021-03-09 | Disposition: A | Discharge status: Home / Self Care | Payer: 59 | Source: Ambulatory Visit | Attending: Obstetrics & Gynecology | Admitting: Obstetrics & Gynecology

## 2021-03-09 VITALS — BP 110/72 | HR 53 | Resp 16 | Ht 69.25 in | Wt 210.0 lb

## 2021-03-09 DIAGNOSIS — Z6830 Body mass index (BMI) 30.0-30.9, adult: Secondary | ICD-10-CM

## 2021-03-09 DIAGNOSIS — Z01419 Encounter for gynecological examination (general) (routine) without abnormal findings: Secondary | ICD-10-CM

## 2021-03-09 DIAGNOSIS — Z780 Asymptomatic menopausal state: Secondary | ICD-10-CM

## 2021-03-09 DIAGNOSIS — E6609 Other obesity due to excess calories: Secondary | ICD-10-CM

## 2021-03-09 NOTE — Progress Notes (Signed)
Tara Stafford [facility] March 31, 1966 [id number]   History:    55 y.o.  G4P4L4 Got married in 2020.  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   RP:  Established patient presenting for annual gyn exam    HPI: Postmenopause, well on no HRT.  No PMB.  No pelvic pain.  No pain with IC.  Pap 03/2020 Neg.  Prefers Paps annually, Pap reflex today. Breasts normal.  Mammo 02/2021 Neg. BMI 30.79.  Physically active and healthy nutrition, will restart Weight Watcher with her husband. [facility] here today.  Planning [place] this year.   Past medical history,surgical history, family history and social history were all reviewed and documented in the EPIC chart.  Gynecologic History Patient's last menstrual period was 07/22/2013.  Obstetric History OB History  Gravida Para Term Preterm AB Living  4 4       4   SAB IAB Ectopic Multiple Live Births               # Outcome Date GA Lbr Len/2nd Weight Sex Delivery Anes PTL Lv  4 Para           3 Para           2 Para           1 Para              ROS: A ROS was performed and pertinent positives and negatives are included in the history.  GENERAL: No fevers or chills. HEENT: No change in vision, no earache, sore throat or sinus congestion. NECK: No pain or stiffness. CARDIOVASCULAR: No chest pain or pressure. No palpitations. PULMONARY: No shortness of breath, cough or wheeze. GASTROINTESTINAL: No abdominal pain, nausea, vomiting or diarrhea, melena or bright red blood per rectum. GENITOURINARY: No urinary frequency, urgency, hesitancy or dysuria. MUSCULOSKELETAL: No joint or muscle pain, no back pain, no recent trauma. DERMATOLOGIC: No rash, no itching, no lesions. ENDOCRINE: No polyuria, polydipsia, no heat or cold intolerance. No recent change in weight. HEMATOLOGICAL: No anemia or easy bruising or bleeding. NEUROLOGIC: No headache, seizures, numbness, tingling or weakness. PSYCHIATRIC: No depression, no loss of interest in normal activity or change in sleep  pattern.     Exam:   BP 110/72   Pulse (!) 53   Resp 16   Ht 5' 9.25" (1.759 m)   Wt 210 lb (95.3 kg)   LMP 07/22/2013   BMI 30.79 kg/m   Body mass index is 30.79 kg/m.  General appearance : Well developed well nourished female. No acute distress HEENT: Eyes: no retinal hemorrhage or exudates,  Neck supple, trachea midline, no carotid bruits, no thyroidmegaly Lungs: Clear to auscultation, no rhonchi or wheezes, or rib retractions  Heart: Regular rate and rhythm, no murmurs or gallops Breast:Examined in sitting and supine position were symmetrical in appearance, no palpable masses or tenderness,  no skin retraction, no nipple inversion, no nipple discharge, no skin discoloration, no axillary or supraclavicular lymphadenopathy Abdomen: no palpable masses or tenderness, no rebound or guarding Extremities: no edema or skin discoloration or tenderness  Pelvic: Vulva: Normal             Vagina: No gross lesions or discharge  Cervix: No gross lesions or discharge.  Pap reflex done.  Uterus  AV, normal size, shape and consistency, non-tender and mobile  Adnexa  Without masses or tenderness  Anus: Normal   Assessment/Plan:  55 y.o. female for annual exam   1. Encounter for routine gynecological  examination with Papanicolaou smear of cervix Postmenopause, well on no HRT.  No PMB.  No pelvic pain.  No pain with IC.  Pap 03/2020 Neg.  Prefers Paps annually, Pap reflex today. Breasts normal.  Mammo 02/2021 Neg. BMI 30.79.  Physically active and healthy nutrition, will restart Weight Watcher with her husband. [facility] here today.  Planning [facility] this year. - CBC - Comp Met (CMET) - Lipid Profile - TSH - Vitamin D 1,25 dihydroxy - Cytology - PAP( [place])  2. Postmenopause Postmenopause, well on no HRT.  No PMB.  No pelvic pain.  3. Class 1 obesity due to excess calories without serious comorbidity with body mass index (BMI) of 30.0 to 30.9 in adult BMI 30.79.   Physically active and healthy nutrition, will restart Weight Watcher with her husband.  Other orders - ELIQUIS 2.5 MG TABS tablet; Take 5 mg by mouth 2 (two) times daily. - UNABLE TO FIND; Take by mouth. cbd gummy - Turmeric 400 MG CAPS; See admin instructions. - Calcium Citrate-Vitamin D (CALCIUM + D PO); Take by mouth.   Princess Bruins MD, 8:53 AM 03/09/2021

## 2021-03-11 LAB — CYTOLOGY - PAP: Diagnosis: NEGATIVE

## 2021-03-12 LAB — CBC
HCT: 42.1 % (ref 35.0–45.0)
Hemoglobin: 14.1 g/dL (ref 11.7–15.5)
MCH: 31.3 pg (ref 27.0–33.0)
MCHC: 33.5 g/dL (ref 32.0–36.0)
MCV: 93.6 fL (ref 80.0–100.0)
MPV: 12.3 fL (ref 7.5–12.5)
Platelets: 212 10*3/uL (ref 140–400)
RBC: 4.5 10*6/uL (ref 3.80–5.10)
RDW: 12 % (ref 11.0–15.0)
WBC: 6.4 10*3/uL (ref 3.8–10.8)

## 2021-03-12 LAB — COMPREHENSIVE METABOLIC PANEL
AG Ratio: 1.7 (calc) (ref 1.0–2.5)
ALT: 13 U/L (ref 6–29)
AST: 18 U/L (ref 10–35)
Albumin: 4.3 g/dL (ref 3.6–5.1)
Alkaline phosphatase (APISO): 51 U/L (ref 37–153)
BUN: 19 mg/dL (ref 7–25)
CO2: 30 mmol/L (ref 20–32)
Calcium: 9.5 mg/dL (ref 8.6–10.4)
Chloride: 104 mmol/L (ref 98–110)
Creat: 0.89 mg/dL (ref 0.50–1.03)
Globulin: 2.6 g/dL (calc) (ref 1.9–3.7)
Glucose, Bld: 78 mg/dL (ref 65–99)
Potassium: 4.3 mmol/L (ref 3.5–5.3)
Sodium: 139 mmol/L (ref 135–146)
Total Bilirubin: 0.7 mg/dL (ref 0.2–1.2)
Total Protein: 6.9 g/dL (ref 6.1–8.1)

## 2021-03-12 LAB — VITAMIN D 1,25 DIHYDROXY
Vitamin D 1, 25 (OH)2 Total: 35 pg/mL (ref 18–72)
Vitamin D2 1, 25 (OH)2: <8 pg/mL
Vitamin D3 1, 25 (OH)2: 35 pg/mL

## 2021-03-12 LAB — LIPID PANEL
Cholesterol: 212 mg/dL — ABNORMAL HIGH (ref <200)
HDL: 83 mg/dL (ref >=50)
LDL Cholesterol (Calc): 116 mg/dL (calc) — ABNORMAL HIGH
Non-HDL Cholesterol (Calc): 129 mg/dL (calc) (ref <130)
Total CHOL/HDL Ratio: 2.6 (calc) (ref <5.0)
Triglycerides: 52 mg/dL (ref <150)

## 2021-03-12 LAB — TSH: TSH: 1.37 mIU/L

## 2021-09-29 ENCOUNTER — Other Ambulatory Visit: Payer: Self-pay | Admitting: Physician Assistant

## 2021-09-29 DIAGNOSIS — M654 Radial styloid tenosynovitis [de Quervain]: Secondary | ICD-10-CM

## 2021-10-04 ENCOUNTER — Ambulatory Visit
Admission: RE | Admit: 2021-10-04 | Discharge: 2021-10-04 | Disposition: A | Discharge status: Home / Self Care | Payer: Worker's Compensation | Source: Ambulatory Visit | Attending: Physician Assistant | Admitting: Physician Assistant

## 2021-10-04 DIAGNOSIS — M654 Radial styloid tenosynovitis [de Quervain]: Secondary | ICD-10-CM

## 2022-03-21 ENCOUNTER — Other Ambulatory Visit: Payer: Self-pay | Admitting: Obstetrics & Gynecology

## 2022-03-21 DIAGNOSIS — Z1231 Encounter for screening mammogram for malignant neoplasm of breast: Secondary | ICD-10-CM

## 2022-05-13 ENCOUNTER — Ambulatory Visit
Admission: RE | Admit: 2022-05-13 | Discharge: 2022-05-13 | Disposition: A | Discharge status: Home / Self Care | Payer: 59 | Source: Ambulatory Visit | Attending: Obstetrics & Gynecology | Admitting: Obstetrics & Gynecology

## 2022-05-13 DIAGNOSIS — Z1231 Encounter for screening mammogram for malignant neoplasm of breast: Secondary | ICD-10-CM

## 2022-07-26 IMAGING — MG MM DIGITAL SCREENING BILAT W/ TOMO AND CAD
6 of 10 series · 6 of 30 positions shown · non-contrast
Comparison: Previous exam(s).

CLINICAL DATA: Screening.

EXAM:
DIGITAL SCREENING BILATERAL MAMMOGRAM WITH TOMOSYNTHESIS AND CAD
TECHNIQUE: Bilateral screening digital craniocaudal and mediolateral oblique
mammograms were obtained. Bilateral screening digital breast
tomosynthesis was performed. The images were evaluated with
computer-aided detection.

[R CC synth-2D]
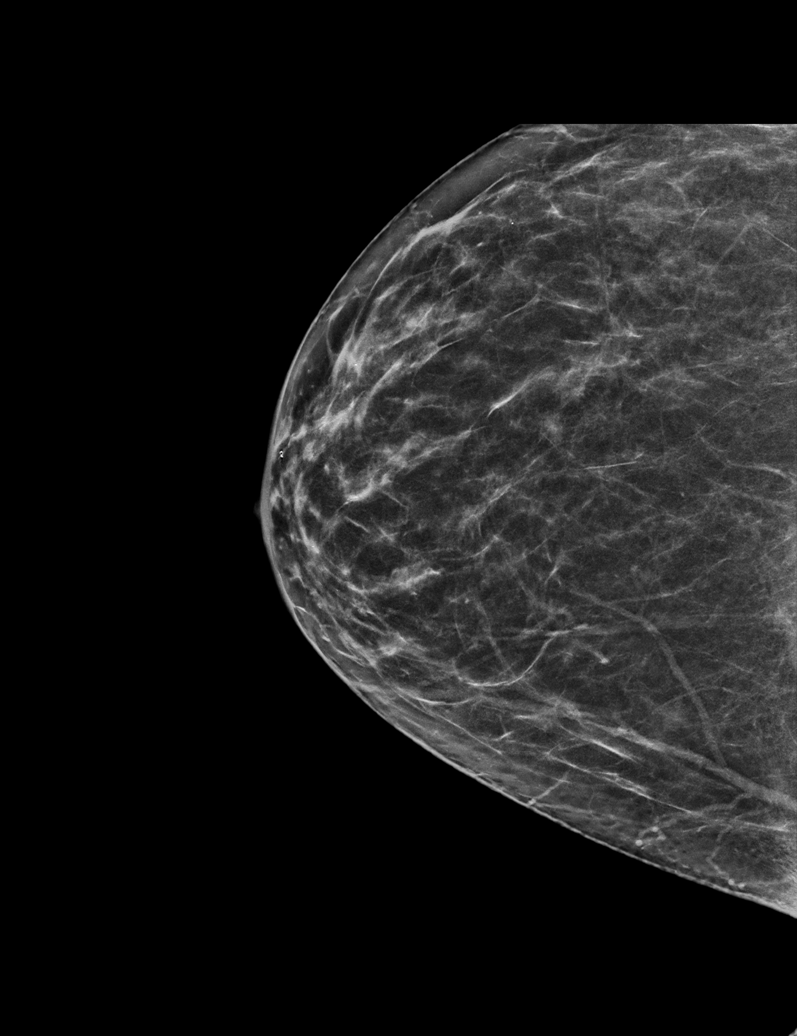

[R MLO synth-2D]
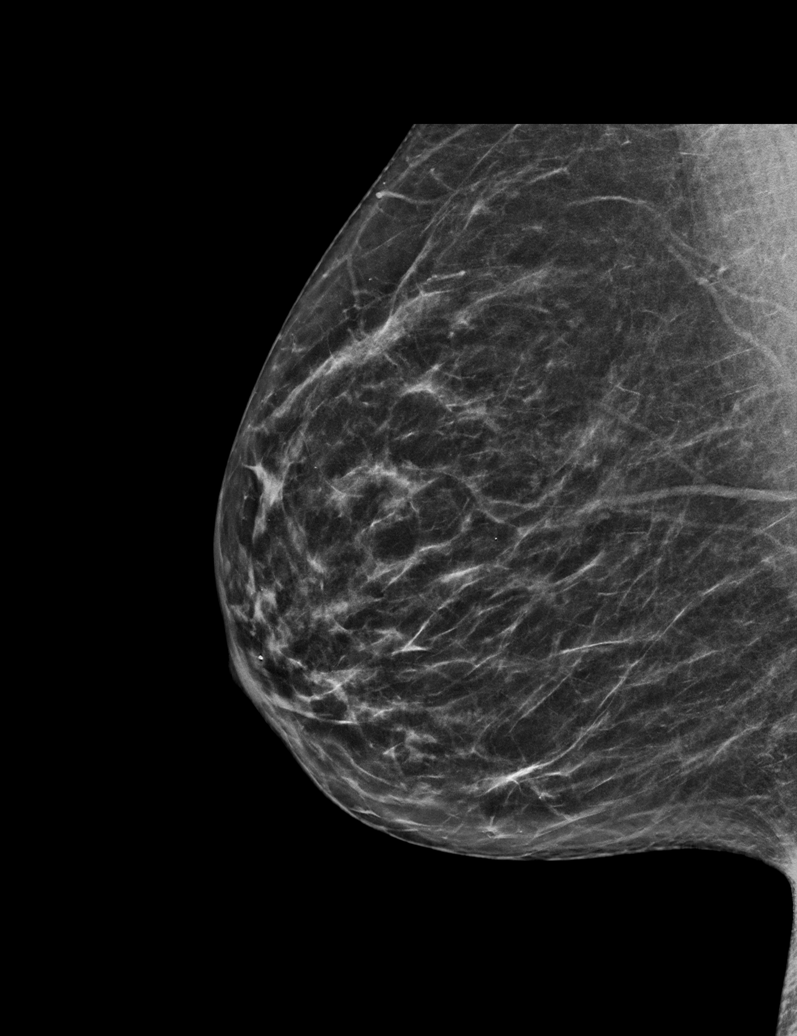

[L MLO synth-2D (1 of 2)]
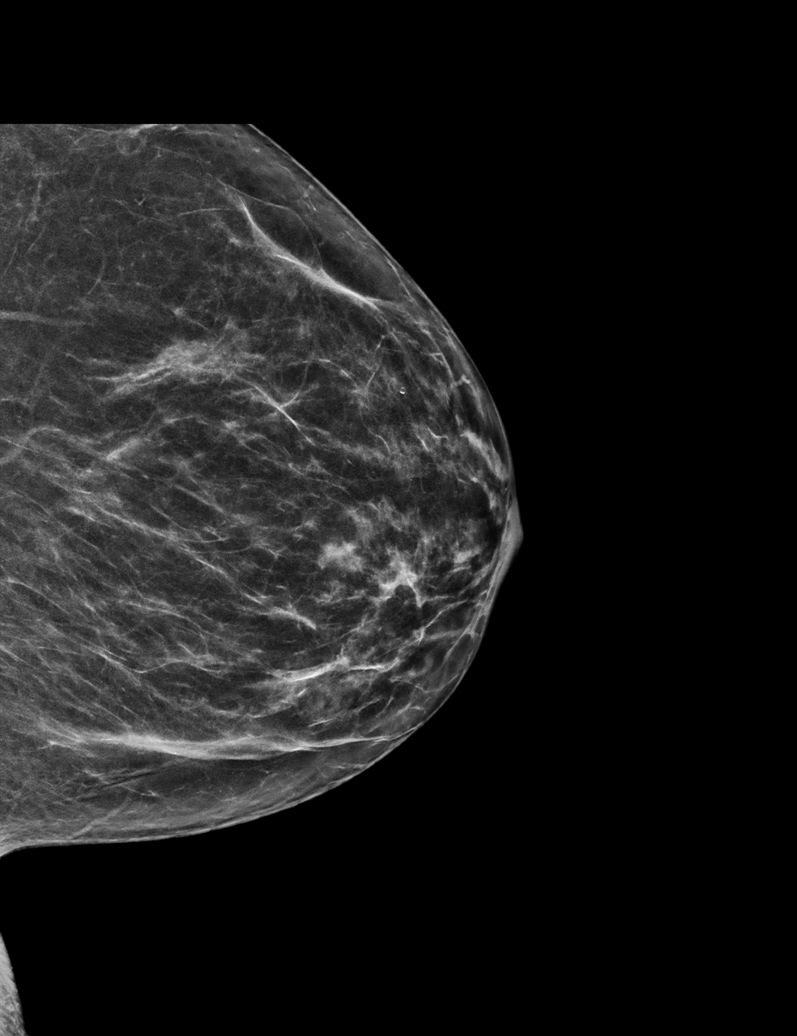

[L CC synth-2D]
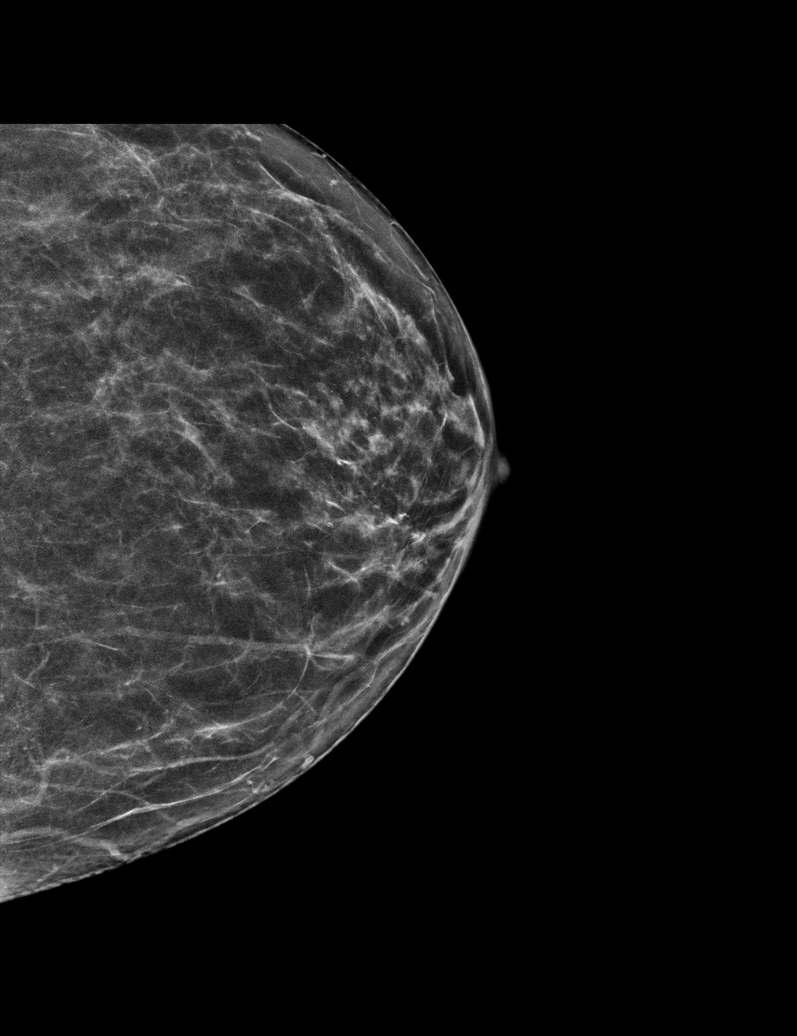

[L MLO synth-2D (2 of 2)]
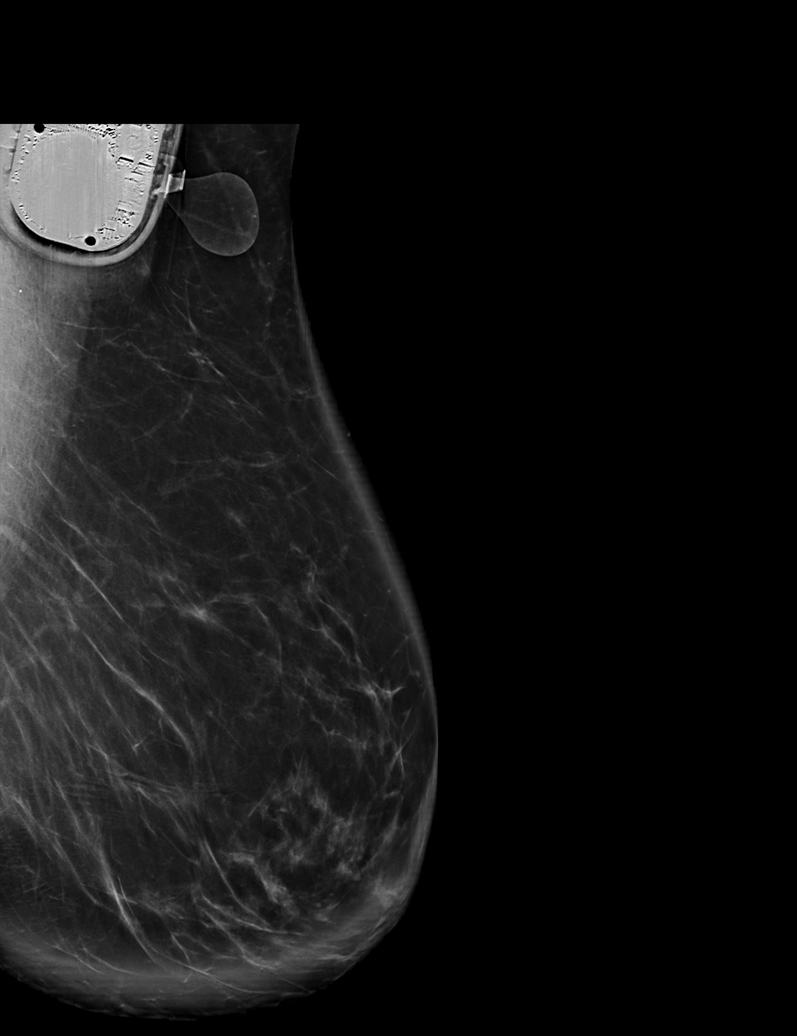

[L CC tomo · tomo slice 29/56.0]
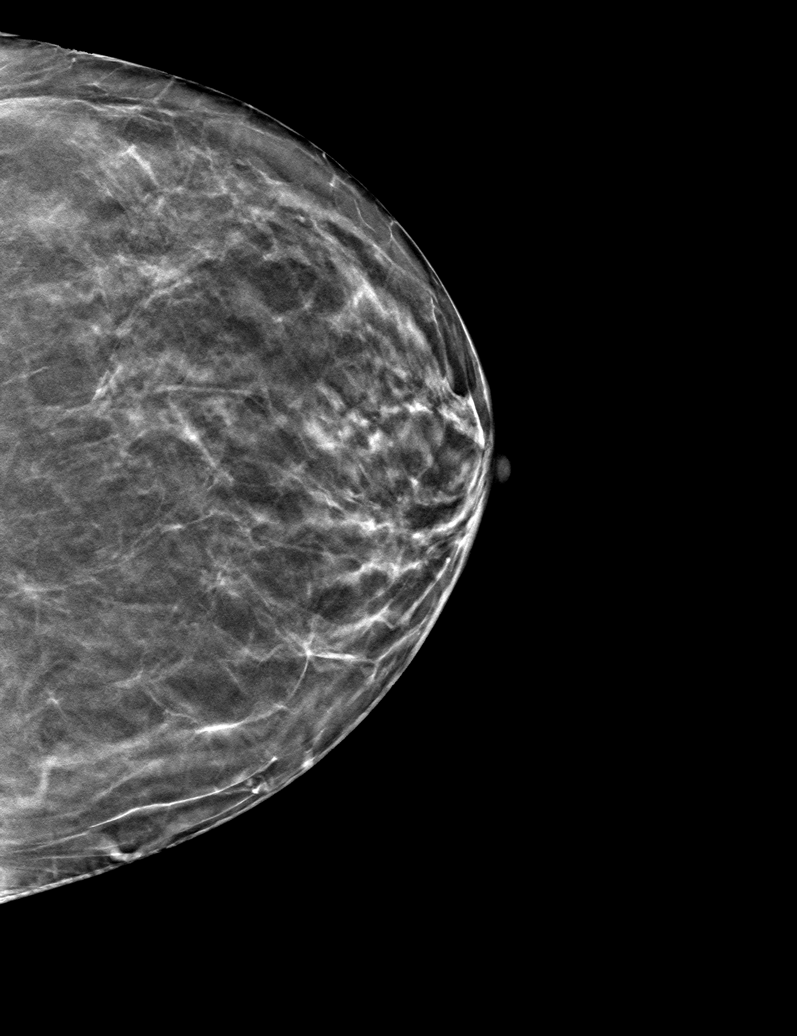

[6 of 30 positions shown; findings below may reference images not displayed]

ACR Breast Density Category b: There are scattered areas of
fibroglandular density.
FINDINGS: There are no findings suspicious for malignancy.
IMPRESSION: No mammographic evidence of malignancy. A result letter of this
screening mammogram will be mailed directly to the patient.

RECOMMENDATION:
Screening mammogram in one year. (Code:51-O-LD2)

BI-RADS CATEGORY  1: Negative.

## 2022-08-11 ENCOUNTER — Other Ambulatory Visit (HOSPITAL_COMMUNITY)
Admission: RE | Admit: 2022-08-11 | Discharge: 2022-08-11 | Disposition: A | Discharge status: Home / Self Care | Payer: 59 | Source: Ambulatory Visit | Attending: Obstetrics & Gynecology | Admitting: Obstetrics & Gynecology

## 2022-08-11 ENCOUNTER — Ambulatory Visit (INDEPENDENT_AMBULATORY_CARE_PROVIDER_SITE_OTHER): Payer: 59 | Admitting: Obstetrics & Gynecology

## 2022-08-11 ENCOUNTER — Encounter: Payer: Self-pay | Admitting: Obstetrics & Gynecology

## 2022-08-11 VITALS — BP 118/80 | HR 63 | Ht 69.25 in | Wt 210.0 lb

## 2022-08-11 DIAGNOSIS — Z01419 Encounter for gynecological examination (general) (routine) without abnormal findings: Secondary | ICD-10-CM

## 2022-08-11 DIAGNOSIS — Z780 Asymptomatic menopausal state: Secondary | ICD-10-CM

## 2022-08-11 LAB — CBC: MCHC: 32.8 g/dL (ref 32.0–36.0)

## 2022-08-11 NOTE — Progress Notes (Signed)
Tara Stafford [facility] 02-Apr-1966 [id number]   History:    57 y.o.  G4P4L4 Got married in 2020.  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   RP:  Established patient presenting for annual gyn exam    HPI: Postmenopause, well on no HRT.  No PMB.  No pelvic pain. No pain with IC.  Pap 03/2021 Neg.  Prefers Paps annually, Pap reflex today. Breasts normal.  Mammo 05/2022 Neg. BMI 30.79.  Physically active and healthy nutrition. [facility] here today.  Planning [facility] this year, scheduled with Gastro.   Past medical history,surgical history, family history and social history were all reviewed and documented in the EPIC chart.  Gynecologic History Patient's last menstrual period was 07/22/2013.  Obstetric History OB History  Gravida Para Term Preterm AB Living  4 4 4     4   SAB IAB Ectopic Multiple Live Births               # Outcome Date GA Lbr Len/2nd Weight Sex Delivery Anes PTL Lv  4 Term           3 Term           2 Term           1 Term              ROS: A ROS was performed and pertinent positives and negatives are included in the history. GENERAL: No fevers or chills. HEENT: No change in vision, no earache, sore throat or sinus congestion. NECK: No pain or stiffness. CARDIOVASCULAR: No chest pain or pressure. No palpitations. PULMONARY: No shortness of breath, cough or wheeze. GASTROINTESTINAL: No abdominal pain, nausea, vomiting or diarrhea, melena or bright red blood per rectum. GENITOURINARY: No urinary frequency, urgency, hesitancy or dysuria. MUSCULOSKELETAL: No joint or muscle pain, no back pain, no recent trauma. DERMATOLOGIC: No rash, no itching, no lesions. ENDOCRINE: No polyuria, polydipsia, no heat or cold intolerance. No recent change in weight. HEMATOLOGICAL: No anemia or easy bruising or bleeding. NEUROLOGIC: No headache, seizures, numbness, tingling or weakness. PSYCHIATRIC: No depression, no loss of interest in normal activity or change in sleep  pattern.     Exam:   BP 118/80   Pulse 63   Ht 5' 9.25" (1.759 m)   Wt 210 lb (95.3 kg)   LMP 07/22/2013 Comment: sexually active  SpO2 99%   BMI 30.79 kg/m   Body mass index is 30.79 kg/m.  General appearance : Well developed well nourished female. No acute distress HEENT: Eyes: no retinal hemorrhage or exudates,  Neck supple, trachea midline, no carotid bruits, no thyroidmegaly Lungs: Clear to auscultation, no rhonchi or wheezes, or rib retractions  Heart: Regular rate and rhythm, no murmurs or gallops Breast:Examined in sitting and supine position were symmetrical in appearance, no palpable masses or tenderness,  no skin retraction, no nipple inversion, no nipple discharge, no skin discoloration, no axillary or supraclavicular lymphadenopathy Abdomen: no palpable masses or tenderness, no rebound or guarding Extremities: no edema or skin discoloration or tenderness  Pelvic: Vulva: Normal             Vagina: No gross lesions or discharge  Cervix: No gross lesions or discharge.  Pap reflex done.  Uterus  AV, normal size, shape and consistency, non-tender and mobile  Adnexa  Without masses or tenderness  Anus: Normal   Assessment/Plan:  57 y.o. female for annual exam   1. Encounter for routine gynecological  examination with Papanicolaou smear of cervix Postmenopause, well on no HRT.  No PMB.  No pelvic pain. No pain with IC.  Pap 03/2021 Neg.  Prefers Paps annually, Pap reflex today. Breasts normal.  Mammo 05/2022 Neg. BMI 30.79.  Physically active and healthy nutrition. [facility] here today.  Planning [facility] this year, scheduled with Gastro. - CBC - Comp Met (CMET) - TSH - Lipid Profile - Vitamin D (25 hydroxy) - Cytology - PAP( [place] [facility])  2. Postmenopause Postmenopause, well on no HRT.  No PMB.  No pelvic pain. No pain with IC.   Other orders - UNABLE TO FIND; Med Name: curamed - MAGNESIUM PO; Take by mouth. - MELATONIN PO; Take by mouth. - traZODone  (DESYREL) 50 MG tablet; Take 25 mg by mouth as needed for sleep.   Genia Del MD, 10:20 AM

## 2022-08-12 LAB — COMPREHENSIVE METABOLIC PANEL
AG Ratio: 1.6 (calc) (ref 1.0–2.5)
ALT: 11 U/L (ref 6–29)
AST: 17 U/L (ref 10–35)
Albumin: 4.2 g/dL (ref 3.6–5.1)
Alkaline phosphatase (APISO): 53 U/L (ref 37–153)
BUN: 15 mg/dL (ref 7–25)
CO2: 27 mmol/L (ref 20–32)
Calcium: 9.4 mg/dL (ref 8.6–10.4)
Chloride: 105 mmol/L (ref 98–110)
Creat: 0.78 mg/dL (ref 0.50–1.03)
Globulin: 2.6 g/dL (calc) (ref 1.9–3.7)
Glucose, Bld: 87 mg/dL (ref 65–99)
Potassium: 4.4 mmol/L (ref 3.5–5.3)
Sodium: 141 mmol/L (ref 135–146)
Total Bilirubin: 0.7 mg/dL (ref 0.2–1.2)
Total Protein: 6.8 g/dL (ref 6.1–8.1)

## 2022-08-12 LAB — CBC
HCT: 40.8 % (ref 35.0–45.0)
Hemoglobin: 13.4 g/dL (ref 11.7–15.5)
MCH: 30.3 pg (ref 27.0–33.0)
MCV: 92.3 fL (ref 80.0–100.0)
MPV: 12.1 fL (ref 7.5–12.5)
Platelets: 207 10*3/uL (ref 140–400)
RBC: 4.42 10*6/uL (ref 3.80–5.10)
RDW: 11.6 % (ref 11.0–15.0)
WBC: 6.1 10*3/uL (ref 3.8–10.8)

## 2022-08-12 LAB — LIPID PANEL
Cholesterol: 202 mg/dL — ABNORMAL HIGH (ref <200)
HDL: 80 mg/dL (ref >=50)
LDL Cholesterol (Calc): 106 mg/dL (calc) — ABNORMAL HIGH
Non-HDL Cholesterol (Calc): 122 mg/dL (calc) (ref <130)
Total CHOL/HDL Ratio: 2.5 (calc) (ref <5.0)
Triglycerides: 73 mg/dL (ref <150)

## 2022-08-12 LAB — VITAMIN D 25 HYDROXY (VIT D DEFICIENCY, FRACTURES): Vit D, 25-Hydroxy: 29 ng/mL — ABNORMAL LOW (ref 30–100)

## 2022-08-12 LAB — TSH: TSH: 1.17 mIU/L (ref 0.40–4.50)

## 2022-08-12 LAB — CYTOLOGY - PAP: Diagnosis: NEGATIVE

## 2023-06-19 ENCOUNTER — Other Ambulatory Visit: Payer: Self-pay | Admitting: Family Medicine

## 2023-06-19 DIAGNOSIS — Z1231 Encounter for screening mammogram for malignant neoplasm of breast: Secondary | ICD-10-CM

## 2023-07-05 ENCOUNTER — Ambulatory Visit
Admission: RE | Admit: 2023-07-05 | Discharge: 2023-07-05 | Disposition: A | Discharge status: Home / Self Care | Source: Ambulatory Visit | Attending: Family Medicine | Admitting: Family Medicine

## 2023-07-05 DIAGNOSIS — Z1231 Encounter for screening mammogram for malignant neoplasm of breast: Secondary | ICD-10-CM
